# Patient Record
Sex: Female | Born: 1945
Health system: Southern US, Community
[De-identification: ages and names within clinical notes are randomized; demographics above are authoritative.]

## PROBLEM LIST (undated history)

## (undated) DIAGNOSIS — I10 Essential (primary) hypertension: Secondary | ICD-10-CM

## (undated) DIAGNOSIS — K529 Noninfective gastroenteritis and colitis, unspecified: Secondary | ICD-10-CM

## (undated) DIAGNOSIS — B029 Zoster without complications: Secondary | ICD-10-CM

## (undated) DIAGNOSIS — K5790 Diverticulosis of intestine, part unspecified, without perforation or abscess without bleeding: Secondary | ICD-10-CM

## (undated) DIAGNOSIS — I319 Disease of pericardium, unspecified: Secondary | ICD-10-CM

## (undated) DIAGNOSIS — R1013 Epigastric pain: Secondary | ICD-10-CM

## (undated) DIAGNOSIS — E559 Vitamin D deficiency, unspecified: Secondary | ICD-10-CM

## (undated) DIAGNOSIS — J189 Pneumonia, unspecified organism: Secondary | ICD-10-CM

## (undated) DIAGNOSIS — M1990 Unspecified osteoarthritis, unspecified site: Secondary | ICD-10-CM

## (undated) DIAGNOSIS — R251 Tremor, unspecified: Secondary | ICD-10-CM

## (undated) DIAGNOSIS — E063 Autoimmune thyroiditis: Secondary | ICD-10-CM

## (undated) DIAGNOSIS — J309 Allergic rhinitis, unspecified: Secondary | ICD-10-CM

## (undated) DIAGNOSIS — N281 Cyst of kidney, acquired: Secondary | ICD-10-CM

## (undated) DIAGNOSIS — Z8719 Personal history of other diseases of the digestive system: Secondary | ICD-10-CM

## (undated) DIAGNOSIS — L309 Dermatitis, unspecified: Secondary | ICD-10-CM

## (undated) DIAGNOSIS — Z9889 Other specified postprocedural states: Secondary | ICD-10-CM

## (undated) DIAGNOSIS — N200 Calculus of kidney: Secondary | ICD-10-CM

## (undated) DIAGNOSIS — E785 Hyperlipidemia, unspecified: Secondary | ICD-10-CM

## (undated) DIAGNOSIS — K219 Gastro-esophageal reflux disease without esophagitis: Secondary | ICD-10-CM

## (undated) DIAGNOSIS — I471 Supraventricular tachycardia, unspecified: Secondary | ICD-10-CM

## (undated) DIAGNOSIS — E039 Hypothyroidism, unspecified: Secondary | ICD-10-CM

## (undated) DIAGNOSIS — H209 Unspecified iridocyclitis: Secondary | ICD-10-CM

## (undated) DIAGNOSIS — E119 Type 2 diabetes mellitus without complications: Secondary | ICD-10-CM

## (undated) DIAGNOSIS — M3500 Sicca syndrome, unspecified: Secondary | ICD-10-CM

## (undated) DIAGNOSIS — R519 Headache, unspecified: Secondary | ICD-10-CM

## (undated) DIAGNOSIS — M8580 Other specified disorders of bone density and structure, unspecified site: Secondary | ICD-10-CM

## (undated) DIAGNOSIS — R2689 Other abnormalities of gait and mobility: Secondary | ICD-10-CM

## (undated) HISTORY — DX: Disease of pericardium, unspecified: I31.9

## (undated) HISTORY — PX: TUBAL LIGATION: SHX77

## (undated) HISTORY — DX: Hyperlipidemia, unspecified: E78.5

## (undated) HISTORY — DX: Other specified disorders of bone density and structure, unspecified site: M85.80

## (undated) HISTORY — DX: Pneumonia, unspecified organism: J18.9

## (undated) HISTORY — DX: Essential (primary) hypertension: I10

## (undated) HISTORY — DX: Sjogren syndrome, unspecified: M35.00

## (undated) HISTORY — PX: BREAST REDUCTION SURGERY: SHX8

## (undated) HISTORY — DX: Unspecified osteoarthritis, unspecified site: M19.90

## (undated) HISTORY — DX: Vitamin D deficiency, unspecified: E55.9

## (undated) HISTORY — DX: Autoimmune thyroiditis: E06.3

## (undated) HISTORY — DX: Zoster without complications: B02.9

## (undated) HISTORY — DX: Supraventricular tachycardia: I47.1

## (undated) HISTORY — DX: Diverticulosis of intestine, part unspecified, without perforation or abscess without bleeding: K57.90

## (undated) HISTORY — PX: DILATION AND CURETTAGE OF UTERUS: SHX78

## (undated) HISTORY — DX: Supraventricular tachycardia, unspecified: I47.10

## (undated) HISTORY — DX: Allergic rhinitis, unspecified: J30.9

## (undated) HISTORY — DX: Unspecified iridocyclitis: H20.9

## (undated) HISTORY — PX: LAPAROSCOPIC ENDOMETRIOSIS FULGURATION: SUR769

## (undated) HISTORY — DX: Epigastric pain: R10.13

## (undated) HISTORY — DX: Dermatitis, unspecified: L30.9

## (undated) HISTORY — DX: Hypothyroidism, unspecified: E03.9

## (undated) HISTORY — DX: Noninfective gastroenteritis and colitis, unspecified: K52.9

## (undated) HISTORY — DX: Calculus of kidney: N20.0

## (undated) HISTORY — DX: Headache, unspecified: R51.9

## (undated) HISTORY — DX: Other abnormalities of gait and mobility: R26.89

## (undated) HISTORY — PX: BLADDER REPAIR: SHX76

## (undated) HISTORY — DX: Tremor, unspecified: R25.1

---

## 1977-01-15 HISTORY — PX: OTHER SURGICAL HISTORY: SHX169

## 1997-01-15 DIAGNOSIS — I319 Disease of pericardium, unspecified: Secondary | ICD-10-CM

## 1997-01-15 HISTORY — DX: Disease of pericardium, unspecified: I31.9

## 1997-08-16 ENCOUNTER — Other Ambulatory Visit: Admission: RE | Admit: 1997-08-16 | Discharge: 1997-08-16 | Payer: Self-pay | Admitting: *Deleted

## 1998-06-30 ENCOUNTER — Emergency Department (HOSPITAL_COMMUNITY): Admission: EM | Admit: 1998-06-30 | Discharge: 1998-06-30 | Payer: Self-pay

## 1998-08-18 ENCOUNTER — Other Ambulatory Visit: Admission: RE | Admit: 1998-08-18 | Discharge: 1998-08-18 | Payer: Self-pay | Admitting: *Deleted

## 1999-03-16 ENCOUNTER — Encounter: Payer: Self-pay | Admitting: *Deleted

## 1999-03-16 ENCOUNTER — Encounter: Admission: RE | Admit: 1999-03-16 | Discharge: 1999-03-16 | Payer: Self-pay | Admitting: *Deleted

## 1999-08-23 ENCOUNTER — Other Ambulatory Visit: Admission: RE | Admit: 1999-08-23 | Discharge: 1999-08-23 | Payer: Self-pay | Admitting: *Deleted

## 2000-07-23 ENCOUNTER — Encounter: Payer: Self-pay | Admitting: *Deleted

## 2000-07-23 ENCOUNTER — Encounter: Admission: RE | Admit: 2000-07-23 | Discharge: 2000-07-23 | Payer: Self-pay | Admitting: *Deleted

## 2000-09-12 ENCOUNTER — Other Ambulatory Visit: Admission: RE | Admit: 2000-09-12 | Discharge: 2000-09-12 | Payer: Self-pay | Admitting: *Deleted

## 2000-11-13 ENCOUNTER — Emergency Department (HOSPITAL_COMMUNITY): Admission: EM | Admit: 2000-11-13 | Discharge: 2000-11-13 | Payer: Self-pay | Admitting: Emergency Medicine

## 2000-11-13 ENCOUNTER — Encounter: Payer: Self-pay | Admitting: Emergency Medicine

## 2001-07-29 ENCOUNTER — Encounter: Admission: RE | Admit: 2001-07-29 | Discharge: 2001-07-29 | Payer: Self-pay | Admitting: *Deleted

## 2001-07-29 ENCOUNTER — Encounter: Payer: Self-pay | Admitting: *Deleted

## 2002-01-05 ENCOUNTER — Other Ambulatory Visit: Admission: RE | Admit: 2002-01-05 | Discharge: 2002-01-05 | Payer: Self-pay | Admitting: *Deleted

## 2002-07-18 ENCOUNTER — Emergency Department (HOSPITAL_COMMUNITY): Admission: EM | Admit: 2002-07-18 | Discharge: 2002-07-18 | Payer: Self-pay | Admitting: *Deleted

## 2002-07-18 ENCOUNTER — Encounter: Payer: Self-pay | Admitting: *Deleted

## 2002-10-12 ENCOUNTER — Encounter: Payer: Self-pay | Admitting: *Deleted

## 2002-10-12 ENCOUNTER — Encounter: Admission: RE | Admit: 2002-10-12 | Discharge: 2002-10-12 | Payer: Self-pay | Admitting: *Deleted

## 2003-07-20 ENCOUNTER — Other Ambulatory Visit: Admission: RE | Admit: 2003-07-20 | Discharge: 2003-07-20 | Payer: Self-pay | Admitting: *Deleted

## 2004-04-23 ENCOUNTER — Emergency Department (HOSPITAL_COMMUNITY): Admission: EM | Admit: 2004-04-23 | Discharge: 2004-04-24 | Payer: Self-pay | Admitting: Emergency Medicine

## 2004-11-01 ENCOUNTER — Other Ambulatory Visit: Admission: RE | Admit: 2004-11-01 | Discharge: 2004-11-01 | Payer: Self-pay | Admitting: *Deleted

## 2005-07-09 ENCOUNTER — Encounter: Admission: RE | Admit: 2005-07-09 | Discharge: 2005-07-09 | Payer: Self-pay | Admitting: *Deleted

## 2005-12-05 ENCOUNTER — Other Ambulatory Visit: Admission: RE | Admit: 2005-12-05 | Discharge: 2005-12-05 | Payer: Self-pay | Admitting: *Deleted

## 2006-05-18 ENCOUNTER — Ambulatory Visit: Payer: Self-pay | Admitting: Vascular Surgery

## 2006-05-18 ENCOUNTER — Encounter: Payer: Self-pay | Admitting: Vascular Surgery

## 2006-05-18 ENCOUNTER — Emergency Department (HOSPITAL_COMMUNITY): Admission: EM | Admit: 2006-05-18 | Discharge: 2006-05-18 | Payer: Self-pay | Admitting: Emergency Medicine

## 2006-07-30 ENCOUNTER — Encounter: Admission: RE | Admit: 2006-07-30 | Discharge: 2006-07-30 | Payer: Self-pay | Admitting: *Deleted

## 2006-12-13 ENCOUNTER — Other Ambulatory Visit: Admission: RE | Admit: 2006-12-13 | Discharge: 2006-12-13 | Payer: Self-pay | Admitting: *Deleted

## 2007-07-31 ENCOUNTER — Encounter: Admission: RE | Admit: 2007-07-31 | Discharge: 2007-07-31 | Payer: Self-pay | Admitting: Gynecology

## 2008-01-13 ENCOUNTER — Other Ambulatory Visit: Admission: RE | Admit: 2008-01-13 | Discharge: 2008-01-13 | Payer: Self-pay | Admitting: Gynecology

## 2008-03-18 ENCOUNTER — Ambulatory Visit (HOSPITAL_COMMUNITY): Admission: RE | Admit: 2008-03-18 | Discharge: 2008-03-18 | Payer: Self-pay | Admitting: Urology

## 2008-03-18 ENCOUNTER — Emergency Department (HOSPITAL_COMMUNITY): Admission: EM | Admit: 2008-03-18 | Discharge: 2008-03-19 | Payer: Self-pay | Admitting: Emergency Medicine

## 2008-04-24 IMAGING — CR DG CHEST 2V
2 series · 2 of 2 positions shown · non-contrast
Comparison: Two view chest x-ray 04/23/2004.

CLINICAL DATA: Cough, shortness of breath. History of asthma. Bilateral lower
extremity edema.

CHEST - 2 VIEW  05/18/2006:

[w chest pa *]
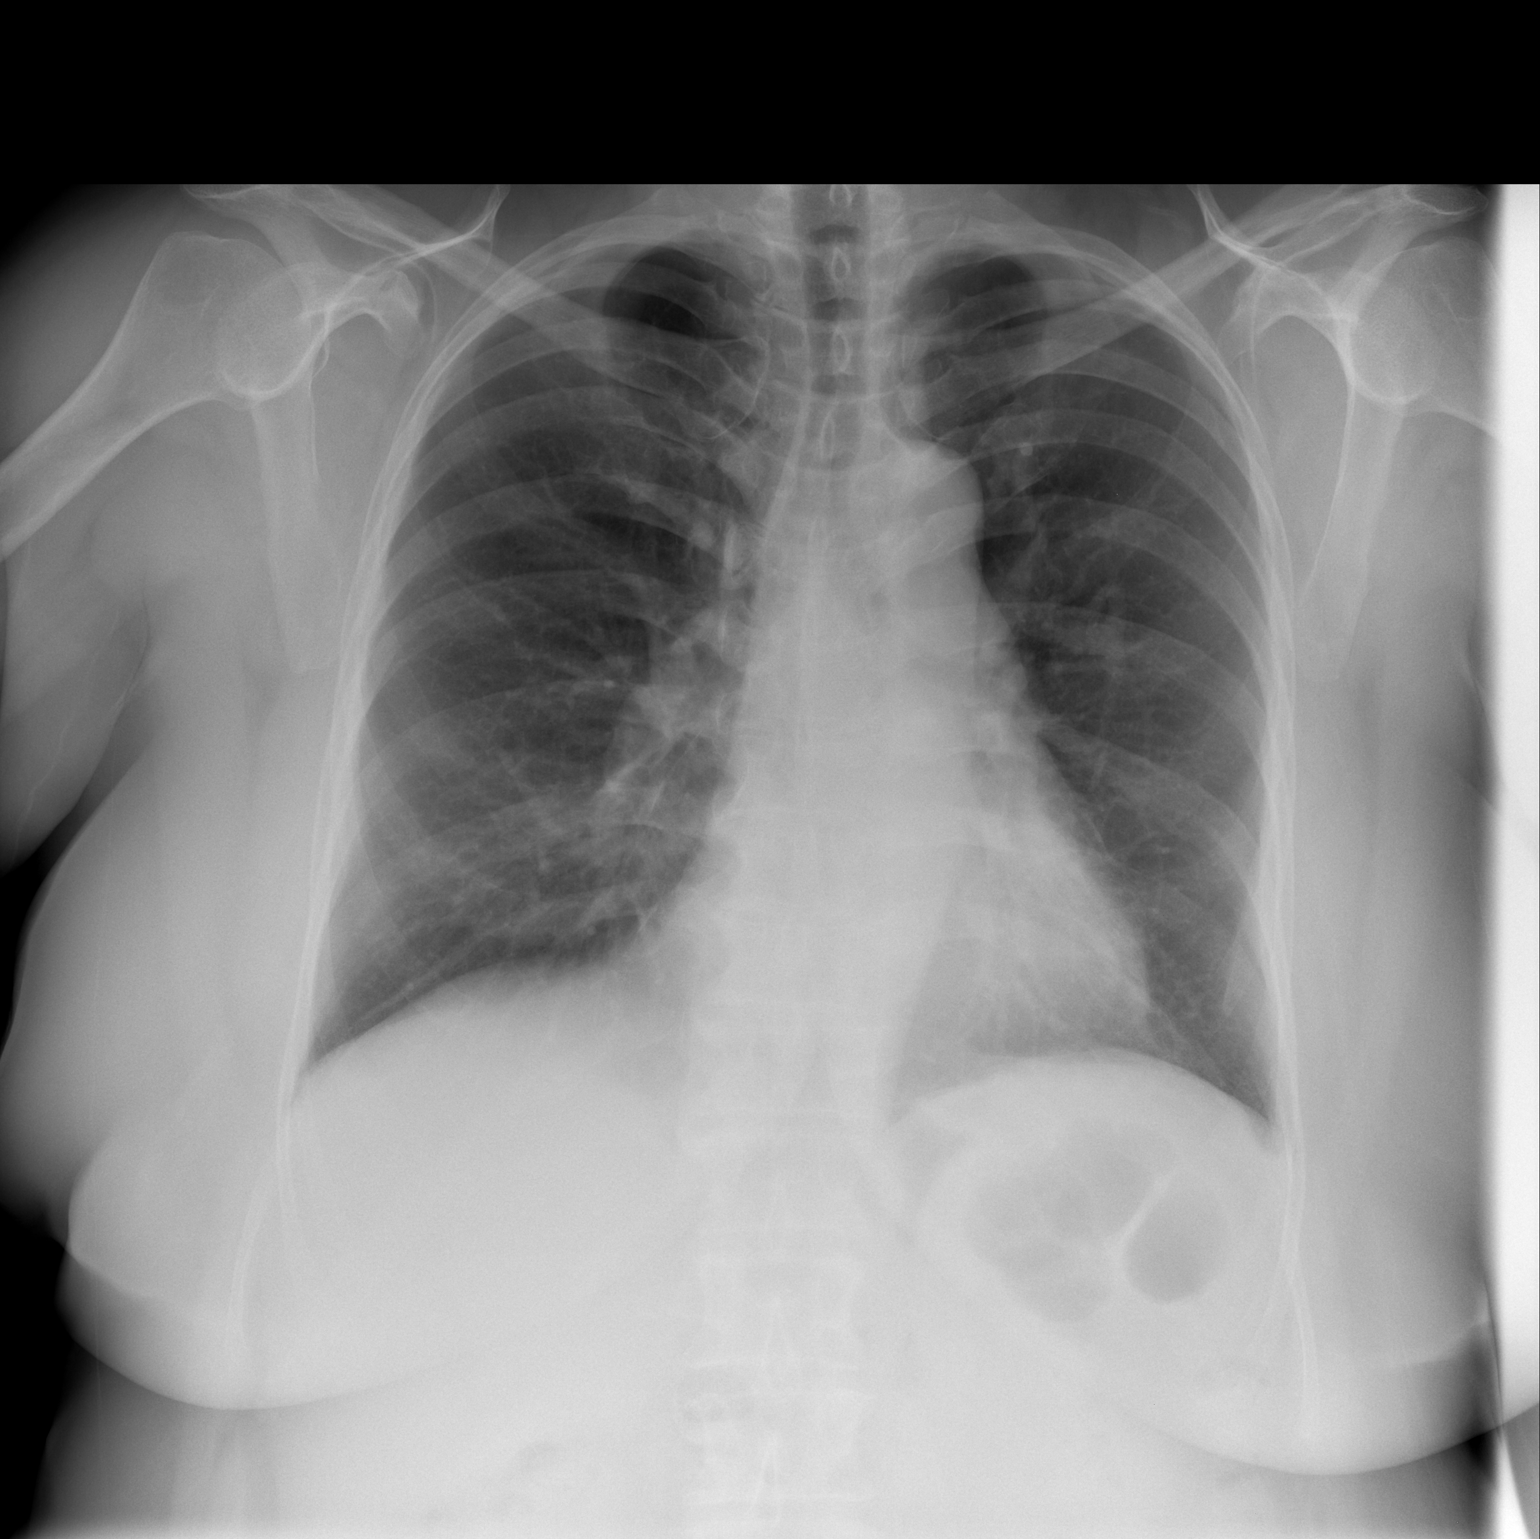

[w chest lat *]
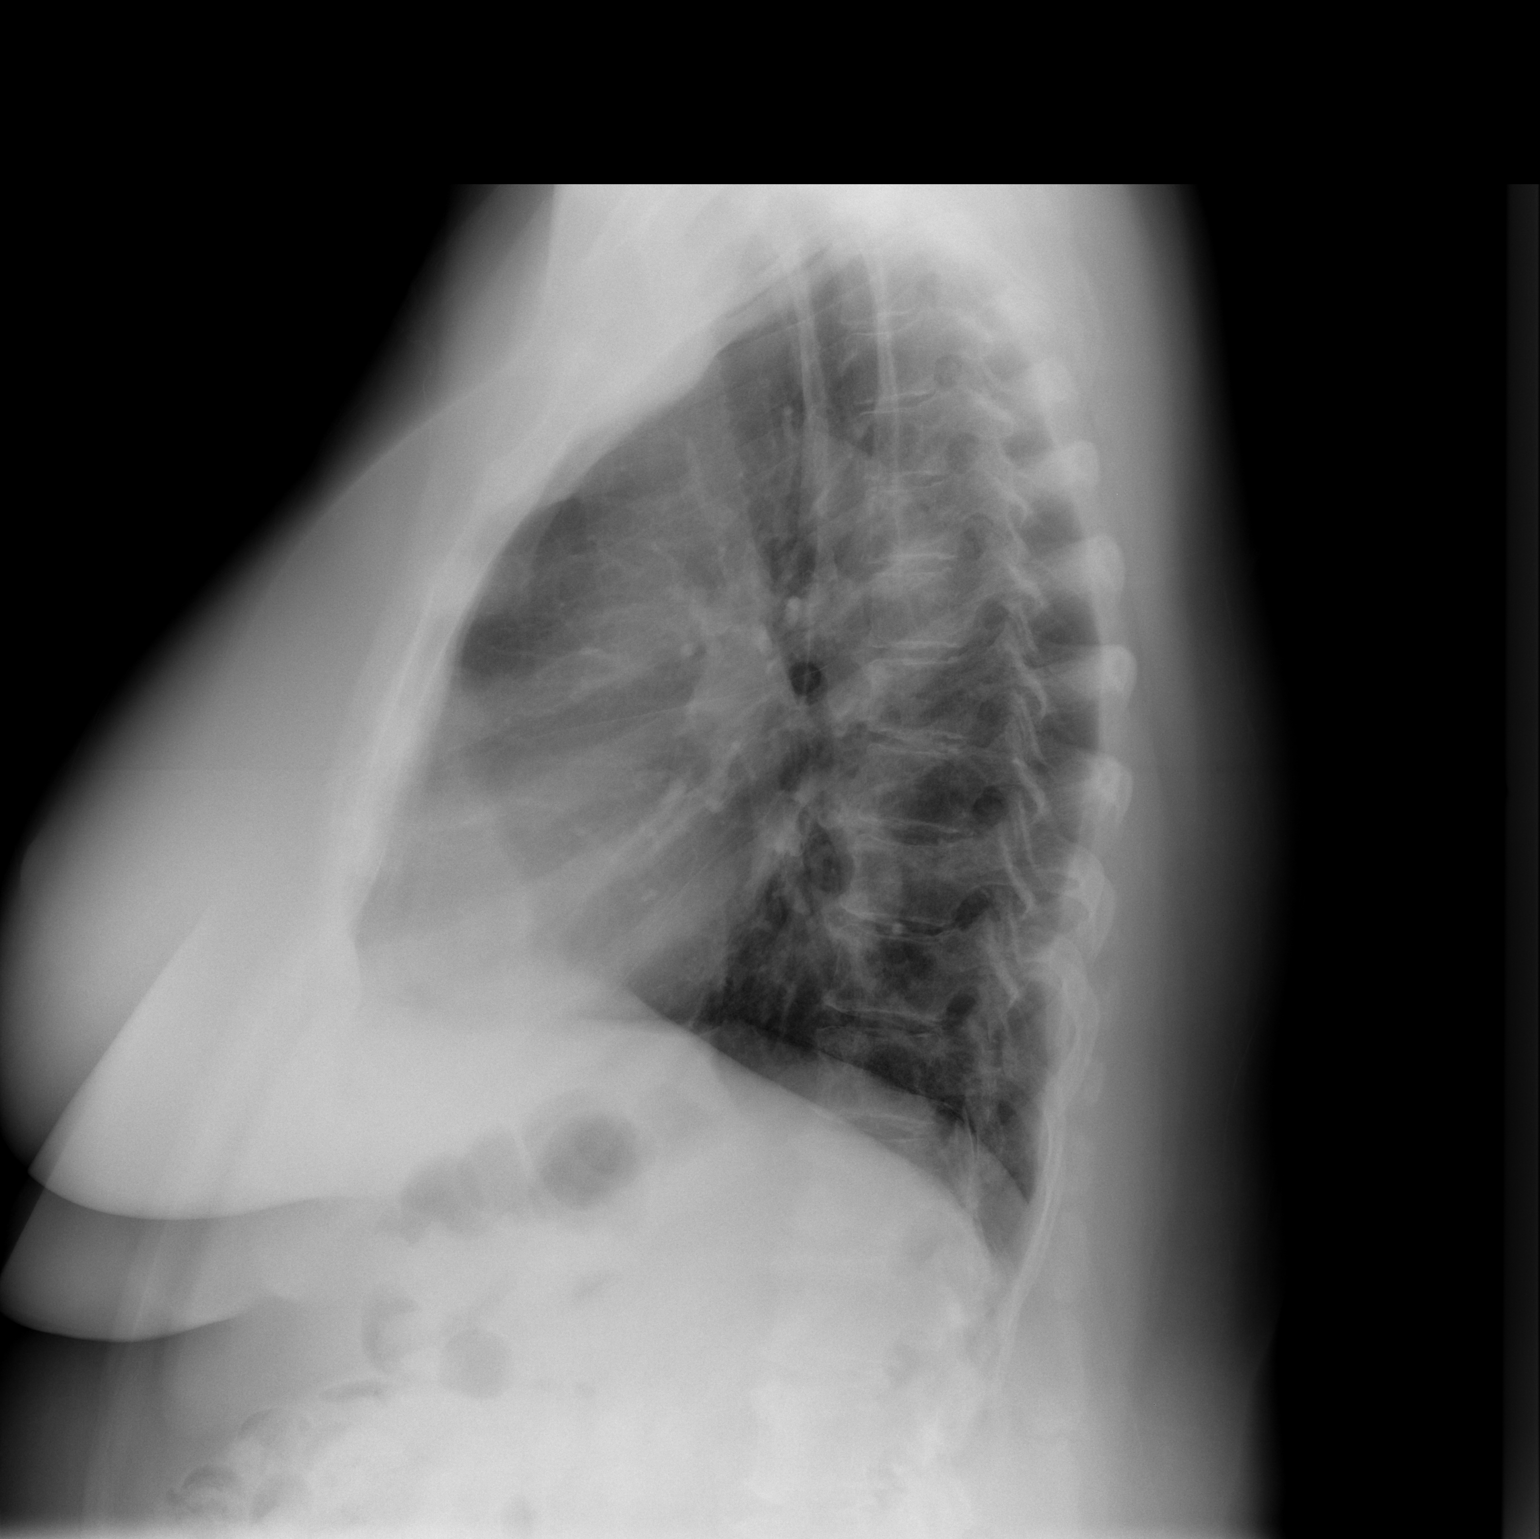

[2 of 2 positions shown; findings below may reference images not displayed]

FINDINGS: Heart size normal and stable. Thoracic aorta mildly tortuous but
unchanged. Hilar and mediastinal contours otherwise unremarkable. Pulmonary
parenchyma clear. No pleural effusions. Degenerative changes throughout the
thoracic spine. No significant interval change.
IMPRESSION: No acute cardiopulmonary disease. Stable since April 2004.

## 2008-08-02 ENCOUNTER — Encounter: Admission: RE | Admit: 2008-08-02 | Discharge: 2008-08-02 | Payer: Self-pay | Admitting: Gynecology

## 2009-07-27 HISTORY — PX: US ECHOCARDIOGRAPHY: HXRAD669

## 2009-08-04 ENCOUNTER — Encounter: Admission: RE | Admit: 2009-08-04 | Discharge: 2009-08-04 | Payer: Self-pay | Admitting: Gynecology

## 2009-11-17 ENCOUNTER — Ambulatory Visit (HOSPITAL_COMMUNITY): Admission: RE | Admit: 2009-11-17 | Discharge: 2009-11-17 | Payer: Self-pay | Admitting: Gynecology

## 2010-02-03 ENCOUNTER — Ambulatory Visit: Payer: Self-pay | Admitting: Cardiology

## 2010-04-27 LAB — URINE MICROSCOPIC-ADD ON

## 2010-04-27 LAB — URINALYSIS, ROUTINE W REFLEX MICROSCOPIC
Protein, ur: 100 mg/dL — AB
Specific Gravity, Urine: 1.011 (ref 1.005–1.030)
Urobilinogen, UA: 0.2 mg/dL (ref 0.0–1.0)
pH: 6 (ref 5.0–8.0)

## 2010-04-27 LAB — BASIC METABOLIC PANEL
Chloride: 103 mEq/L (ref 96–112)
Creatinine, Ser: 0.75 mg/dL (ref 0.4–1.2)
GFR calc Af Amer: >60 mL/min (ref >60)
Glucose, Bld: 123 mg/dL — ABNORMAL HIGH (ref 70–99)
Potassium: 3.5 mEq/L (ref 3.5–5.1)
Sodium: 135 mEq/L (ref 135–145)

## 2010-04-27 LAB — HEMOGLOBIN AND HEMATOCRIT, BLOOD: HCT: 38.7 % (ref 36.0–46.0)

## 2010-05-30 NOTE — Op Note (Signed)
Betty Fox, Betty Fox                 ACCOUNT NO.:  [id number]   MEDICAL RECORD NO.:  [id number]          PATIENT TYPE:  AMB   LOCATION:  DAY                          FACILITY:  [facility]   PHYSICIAN:  Sigmund I. Patsi Sears, M.D.DATE OF BIRTH:  09-27-1945   DATE OF PROCEDURE:  DATE OF DISCHARGE:                               OPERATIVE REPORT   PREOPERATIVE DIAGNOSES:  1. Left lower ureteral calculus.  2. Intrinsic sphincter deficiency.  3. Urinary continence.   POSTOPERATIVE DIAGNOSES:  1. Left lower ureteral calculus.  2. Intrinsic sphincter deficiency.  3. Urinary continence.  4. Left lower ureteral stricture.   OPERATIONS:  1. Cystourethroscopy, left retrograde pyelogram with interpretation of      left ureteroscopy.  2. Dilation of left ureteral stricture, left double-J stent measuring      6-French x 24 cm.  3. Injection of Macroplastique in the urethra for intrinsic sphincter      deficiency.   PREPARATION:  After appropriate preanesthesia, the patient is brought to  the operating room, placed on the operating room in dorsal supine  position where general LMA anesthesia was introduced.  She was then  replaced in dorsal lithotomy position where the pubis was prepped with  Betadine solution and draped in usual fashion.   REVIEW OF HISTORY:  This 65 year old female has a history of total  urinary incontinence, with urodynamic measured Valsalva leak point  pressure of 41 cm of water.  The evaluation by CT scan has shown to two  separate 2-mm left ureteral calculi.  The patient did pass once stone,  but has not seen the  other stone passed.  She is for retrograde  pyelography today for in addition to [facility] plastic injection.   PROCEDURE:  Cystourethroscopy was accomplished, and left retrograde  pyelogram shows a narrowing of the left lower ureter in the intramural  portion.  I do not see any intra-ureteral stone.  There is a small  calcification lateral to the left ureter  noted.  Ureteroscopy  accomplished, and left ureteral stricture is identified from previous  stone imbedded in her previous stone placement.  The ureter was scoped  to the mid ureter, and no stones identified.  Retrograde pyelogram  appears to be normal and there is no more hydronephrosis.  Because of  the ureteral stricture and dilation, I elected to pass a double-J stent,  and placed a 6 x 24 cm double-J stent in the left renal pelvis, and then  coiled in the bladder.  The bladder was drained of fluid, attention was  directed to the urethra.  Two vials of  Macroplastique were then injected per protocol in the submucosal  urethra, and the patient tolerated well.  Excellent ballooning of the  urethral mucosa was noted.  The scope was withdrawn, the patient was  given IV Toradol, awakened, taken recovery room in good condition.      Sigmund I. Patsi Sears, M.D.  Electronically Signed     SIT/MEDQ  D:  03/18/2008  T:  03/18/2008  Job:  [id number]

## 2010-09-19 ENCOUNTER — Other Ambulatory Visit: Payer: Self-pay | Admitting: Cardiology

## 2010-09-19 DIAGNOSIS — I119 Hypertensive heart disease without heart failure: Secondary | ICD-10-CM

## 2010-09-19 DIAGNOSIS — E876 Hypokalemia: Secondary | ICD-10-CM

## 2010-09-19 MED ORDER — ATENOLOL 25 MG PO TABS: 25.0000 mg | ORAL_TABLET | Freq: Every day | ORAL | Status: DC

## 2010-09-19 MED ORDER — POTASSIUM CHLORIDE 10 MEQ PO TBCR: 10.0000 meq | EXTENDED_RELEASE_TABLET | Freq: Two times a day (BID) | ORAL | Status: DC

## 2010-09-19 MED ORDER — AMLODIPINE BESYLATE 10 MG PO TABS: 10.0000 mg | ORAL_TABLET | Freq: Every day | ORAL | Status: DC

## 2010-09-19 NOTE — Telephone Encounter (Signed)
done

## 2010-09-19 NOTE — Telephone Encounter (Signed)
Called needing a 90 day refill of Amlodipine (Besylate) 10mg  with [facility] [phone] and she needs a short term supply (about 8 days) from [facility] [phone]. Please call back. I have pulled her chart.

## 2010-09-20 ENCOUNTER — Ambulatory Visit: Payer: Medicare Other | Attending: Internal Medicine | Admitting: Physical Therapy

## 2010-09-20 DIAGNOSIS — M6281 Muscle weakness (generalized): Secondary | ICD-10-CM | POA: Insufficient documentation

## 2010-09-20 DIAGNOSIS — M25569 Pain in unspecified knee: Secondary | ICD-10-CM | POA: Insufficient documentation

## 2010-09-20 DIAGNOSIS — IMO0001 Reserved for inherently not codable concepts without codable children: Secondary | ICD-10-CM | POA: Insufficient documentation

## 2010-09-27 ENCOUNTER — Ambulatory Visit: Payer: Medicare Other | Admitting: Physical Therapy

## 2010-09-28 ENCOUNTER — Ambulatory Visit: Payer: Medicare Other | Admitting: Physical Therapy

## 2010-10-06 ENCOUNTER — Encounter: Payer: Self-pay | Admitting: Cardiology

## 2010-10-10 ENCOUNTER — Ambulatory Visit: Payer: Medicare Other | Admitting: Physical Therapy

## 2010-10-12 ENCOUNTER — Ambulatory Visit: Payer: Medicare Other | Admitting: Physical Therapy

## 2010-10-17 ENCOUNTER — Ambulatory Visit: Payer: Medicare Other | Attending: Internal Medicine | Admitting: Physical Therapy

## 2010-10-17 DIAGNOSIS — IMO0001 Reserved for inherently not codable concepts without codable children: Secondary | ICD-10-CM | POA: Insufficient documentation

## 2010-10-17 DIAGNOSIS — M6281 Muscle weakness (generalized): Secondary | ICD-10-CM | POA: Insufficient documentation

## 2010-10-17 DIAGNOSIS — M25569 Pain in unspecified knee: Secondary | ICD-10-CM | POA: Insufficient documentation

## 2010-10-19 ENCOUNTER — Ambulatory Visit: Payer: Medicare Other | Admitting: Physical Therapy

## 2010-10-19 ENCOUNTER — Encounter: Payer: Self-pay | Admitting: Cardiology

## 2010-10-19 ENCOUNTER — Ambulatory Visit (INDEPENDENT_AMBULATORY_CARE_PROVIDER_SITE_OTHER): Payer: Medicare Other | Admitting: Cardiology

## 2010-10-19 VITALS — BP 110/73 | HR 55 | Ht 63.0 in | Wt 214.0 lb

## 2010-10-19 DIAGNOSIS — J45909 Unspecified asthma, uncomplicated: Secondary | ICD-10-CM | POA: Insufficient documentation

## 2010-10-19 DIAGNOSIS — E7800 Pure hypercholesterolemia, unspecified: Secondary | ICD-10-CM | POA: Insufficient documentation

## 2010-10-19 DIAGNOSIS — I119 Hypertensive heart disease without heart failure: Secondary | ICD-10-CM | POA: Insufficient documentation

## 2010-10-19 DIAGNOSIS — E039 Hypothyroidism, unspecified: Secondary | ICD-10-CM

## 2010-10-19 MED ORDER — ATORVASTATIN CALCIUM 10 MG PO TABS: 10.0000 mg | ORAL_TABLET | ORAL | Status: DC

## 2010-10-19 NOTE — Patient Instructions (Signed)
continue same dose of medications  Your physician wants you to follow-up in: 6 months You will receive a reminder letter in the mail two months in advance. If you don't receive a letter, please call our office to schedule the follow-up appointment.  

## 2010-10-19 NOTE — Assessment & Plan Note (Signed)
Patient has a past history of asthma.  She did use her inhaler yesterday.  She uses it just on when necessary basis or

## 2010-10-19 NOTE — Assessment & Plan Note (Signed)
The patient has a history of hypercholesterolemia.  She is on low dose Lipitor.  She is not having side effects from Lipitor to

## 2010-10-19 NOTE — Assessment & Plan Note (Signed)
The patient has not had any aches or dizziness.  She denies any increased exertional dyspnea.  No syncope

## 2010-10-19 NOTE — Progress Notes (Signed)
Betty Fox Date of Birth:  04/18/1945 [facility] [address]   [address] [place], [place]  [address] [phone]           Fax   [phone]  History of Present Illness: This pleasant 65 year old woman is seen for a scheduled followup six-month visit.  She has a history of essential hypertension, hypothyroidism, dyslipidemia, and asthma.  She has a history of remote pericarditis about 10 years ago.  She has a history of allergy to ACE inhibitors, which caused cough.  She has a history of supraventricular tachycardia.  These episodes can usually be aborted by doing a Valsalva.  Has a history of hypothyroidism, followed by Dr. Sharl Ma.  She is not diabetic  Current Outpatient Prescriptions  Medication Sig Dispense Refill  . amLODipine (NORVASC) 10 MG tablet Take 1 tablet (10 mg total) by mouth daily.  90 tablet  3  . atenolol (TENORMIN) 25 MG tablet Take 1 tablet (25 mg total) by mouth daily.  90 tablet  3  . atorvastatin (LIPITOR) 10 MG tablet Take 10 mg by mouth every other day.        . Beclomethasone Dipropionate (QVAR IN) Inhale into the lungs as needed.        . Cetirizine HCl (ZYRTEC PO) Take by mouth as needed.        . desoximetasone (TOPICORT) 0.25 % cream Apply topically as needed.        . hydrochlorothiazide (HYDRODIURIL) 25 MG tablet Take 25 mg by mouth every other day.        . levothyroxine (SYNTHROID, LEVOTHROID) 125 MCG tablet Take 125 mcg by mouth daily.        . potassium chloride (KLOR-CON 10) 10 MEQ CR tablet Take 1 tablet (10 mEq total) by mouth 2 (two) times daily.  180 tablet  3    Allergies  Allergen Reactions  . Ace Inhibitors   . Ampicillin   . Prinivil (Lisinopril) Cough  . Tums     Patient Active Problem List  Diagnoses  . Benign hypertensive heart disease without heart failure  . Pure hypercholesterolemia  . Hypothyroid  . Asthma    History  Smoking status  . Never Smoker   Smokeless tobacco  . Not on file     History  Alcohol Use No    No family history on file.  Review of Systems: Constitutional: no fever chills diaphoresis or fatigue or change in weight.  Head and neck: no hearing loss, no epistaxis, no photophobia or visual disturbance. Respiratory: No cough, shortness of breath or wheezing. Cardiovascular: No chest pain peripheral edema, palpitations. Gastrointestinal: No abdominal distention, no abdominal pain, no change in bowel habits hematochezia or melena. Genitourinary: No dysuria, no frequency, no urgency, no nocturia. Musculoskeletal:No arthralgias, no back pain, no gait disturbance or myalgias. Neurological: No dizziness, no headaches, no numbness, no seizures, no syncope, no weakness, no tremors. Hematologic: No lymphadenopathy, no easy bruising. Psychiatric: No confusion, no hallucinations, no sleep disturbance.    Physical Exam: Filed Vitals:   10/19/10 1146  BP: 110/73  Pulse: 55   General appearance reveals a well-developed, well-nourished woman in no distress.Pupils equal and reactive.   Extraocular Movements are full.  There is no scleral icterus.  The mouth and pharynx are normal.  The neck is supple.  The carotids reveal no bruits.  The jugular venous pressure is normal.  The thyroid is not enlarged.  There is no lymphadenopathy.  The chest  is clear to percussion and auscultation. There are no rales or rhonchi. Expansion of the chest is symmetrical.  The precordium is quiet.  The first heart sound is normal.  The second heart sound is physiologically split.  There is no murmur gallop rub or click.  There is no abnormal lift or heave.  The abdomen is soft and nontender. Bowel sounds are normal. The liver and spleen are not enlarged. There Are no abdominal masses. There are no bruits.  The pedal pulses are good.  There is no phlebitis or edema.  There is no cyanosis or clubbing. Strength is normal and symmetrical in all extremities.  There is no lateralizing  weakness.  There are no sensory deficits.  The skin is warm and dry.  There is no rash.   Assessment / Plan:  continue same medication.  She is getting physical therapy to help her balance and her osteoporosis and bone density twice a week and she should continue this.  Recheck in 6 months

## 2010-10-23 ENCOUNTER — Ambulatory Visit: Payer: Medicare Other | Admitting: Physical Therapy

## 2010-10-23 ENCOUNTER — Other Ambulatory Visit: Payer: Self-pay | Admitting: Cardiology

## 2010-10-30 ENCOUNTER — Ambulatory Visit: Payer: Medicare Other | Admitting: Physical Therapy

## 2010-11-02 ENCOUNTER — Ambulatory Visit: Payer: Medicare Other | Admitting: Physical Therapy

## 2011-04-30 ENCOUNTER — Ambulatory Visit (INDEPENDENT_AMBULATORY_CARE_PROVIDER_SITE_OTHER): Payer: Medicare Other | Admitting: Cardiology

## 2011-04-30 ENCOUNTER — Encounter: Payer: Self-pay | Admitting: Cardiology

## 2011-04-30 VITALS — BP 138/84 | HR 56 | Ht 63.0 in | Wt 206.0 lb

## 2011-04-30 DIAGNOSIS — J45909 Unspecified asthma, uncomplicated: Secondary | ICD-10-CM

## 2011-04-30 DIAGNOSIS — E7800 Pure hypercholesterolemia, unspecified: Secondary | ICD-10-CM

## 2011-04-30 DIAGNOSIS — I119 Hypertensive heart disease without heart failure: Secondary | ICD-10-CM

## 2011-04-30 DIAGNOSIS — R002 Palpitations: Secondary | ICD-10-CM

## 2011-04-30 NOTE — Assessment & Plan Note (Signed)
The patient has been having no chest pain or shortness of breath.  Since last visit she has been on a more careful diet and has lost 8 pounds and her energy level has improved

## 2011-04-30 NOTE — Assessment & Plan Note (Signed)
The patient brought in a copy of her recent lab work which is excellent.  We did not have to draw any labs today.

## 2011-04-30 NOTE — Assessment & Plan Note (Signed)
Her breathing has been satisfactory and she has not had any recent flareup of her asthma.

## 2011-04-30 NOTE — Progress Notes (Signed)
Betty Fox Date of Birth:  May 13, 1945 [facility] [address] [place], [place]  [address] [phone]         Fax   [phone]  History of Present Illness: This pleasant 66 year old woman is seen for a six-month followup office visit.  She has a history of essential hypertension hypothyroidism dyslipidemia and asthma.  About 10 years ago she had pericarditis.  She has had no recurrent symptoms of pericarditis.  She has a history of allergy to ACE inhibitors which cause a cough.  He's had a past history of supraventricular tachycardia which she can abort by doing a Valsalva maneuver.  He has a history of hypothyroidism followed by endocrinology.  Current Outpatient Prescriptions  Medication Sig Dispense Refill  . ALBUTEROL IN Inhale into the lungs. As needed      . amLODipine (NORVASC) 10 MG tablet Take 1 tablet (10 mg total) by mouth daily.  90 tablet  3  . atenolol (TENORMIN) 25 MG tablet Take 1 tablet (25 mg total) by mouth daily.  90 tablet  3  . atorvastatin (LIPITOR) 10 MG tablet Take 1 tablet (10 mg total) by mouth every other day.  45 tablet  3  . Beclomethasone Dipropionate (QVAR IN) Inhale into the lungs as needed.        . Cetirizine HCl (ZYRTEC PO) Take by mouth as needed.        . desoximetasone (TOPICORT) 0.25 % cream Apply topically as needed.        . hydrochlorothiazide (HYDRODIURIL) 25 MG tablet TAKE 1 TABLET EVERY OTHER DAY OR AS DIRECTED  90 tablet  3  . levothyroxine (SYNTHROID, LEVOTHROID) 125 MCG tablet Take 125 mcg by mouth daily.        . potassium chloride (KLOR-CON 10) 10 MEQ CR tablet Take 1 tablet (10 mEq total) by mouth 2 (two) times daily.  180 tablet  3    Allergies  Allergen Reactions  . Ace Inhibitors   . Ampicillin   . Prinivil (Lisinopril) Cough  . Tums     Patient Active Problem List  Diagnoses  . Benign hypertensive heart disease without heart failure  . Pure hypercholesterolemia  . Hypothyroid  . Asthma     History  Smoking status  . Never Smoker   Smokeless tobacco  . Not on file    History  Alcohol Use No    No family history on file.  Review of Systems: Constitutional: no fever chills diaphoresis or fatigue or change in weight.  Head and neck: no hearing loss, no epistaxis, no photophobia or visual disturbance. Respiratory: No cough, shortness of breath or wheezing. Cardiovascular: No chest pain peripheral edema, palpitations. Gastrointestinal: No abdominal distention, no abdominal pain, no change in bowel habits hematochezia or melena. Genitourinary: No dysuria, no frequency, no urgency, no nocturia. Musculoskeletal:No arthralgias, no back pain, no gait disturbance or myalgias. Neurological: No dizziness, no headaches, no numbness, no seizures, no syncope, no weakness, no tremors. Hematologic: No lymphadenopathy, no easy bruising. Psychiatric: No confusion, no hallucinations, no sleep disturbance.    Physical Exam: Filed Vitals:   04/30/11 0950  BP: 138/84  Pulse: 56   the general appearance reveals a well-developed slightly obese woman in no distress.Pupils equal and reactive.   Extraocular Movements are full.  There is no scleral icterus.  The mouth and pharynx are normal.  The neck is supple.  The carotids reveal no bruits.  The jugular venous pressure is normal.  The thyroid  is not enlarged.  There is no lymphadenopathy.  The chest is clear to percussion and auscultation. There are no rales or rhonchi. Expansion of the chest is symmetrical.  The precordium is quiet.  The first heart sound is normal.  The second heart sound is physiologically split.  There is no murmur gallop rub or click.  There is no abnormal lift or heave.  The abdomen is soft and nontender. Bowel sounds are normal. The liver and spleen are not enlarged. There Are no abdominal masses. There are no bruits.  The pedal pulses are good.  There is no phlebitis or edema.  There is no cyanosis or  clubbing. Strength is normal and symmetrical in all extremities.  There is no lateralizing weakness.  There are no sensory deficits.  The skin is warm and dry.  There is no rash.   EKG today shows sinus bradycardia and is otherwise within normal limits.   Assessment / Plan: Continue same medication.  She will be having a wisdom tooth removed by Dr. Bradly Chris recently.  She may have to see a podiatrist about some problems with heel spurs.  She will continue careful low carbohydrate high fiber diet with weight loss.  Recheck in 6 months for office visit and basal metabolic panel.  She does have a past history of low potassium.

## 2011-04-30 NOTE — Patient Instructions (Signed)
Your physician wants you to follow-up in: 6 months with Dr. Patty Sermons.  You will receive a reminder letter in the mail two months in advance. If you don't receive a letter, please call our office to schedule the follow-up appointment.  Your physician recommends that you return for lab work in: 6 months  - BMET

## 2011-06-27 ENCOUNTER — Encounter: Payer: Self-pay | Admitting: Cardiology

## 2011-08-06 ENCOUNTER — Other Ambulatory Visit: Payer: Self-pay | Admitting: Dermatology

## 2011-09-12 ENCOUNTER — Other Ambulatory Visit: Payer: Self-pay | Admitting: Cardiology

## 2011-09-12 NOTE — Telephone Encounter (Signed)
Refilled atenolol

## 2011-09-26 ENCOUNTER — Other Ambulatory Visit: Payer: Self-pay | Admitting: Cardiology

## 2011-09-26 NOTE — Telephone Encounter (Signed)
Refilled hctz 

## 2011-10-18 ENCOUNTER — Ambulatory Visit: Payer: Medicare Other | Admitting: Cardiology

## 2011-11-19 ENCOUNTER — Ambulatory Visit (INDEPENDENT_AMBULATORY_CARE_PROVIDER_SITE_OTHER): Payer: Medicare Other | Admitting: Cardiology

## 2011-11-19 ENCOUNTER — Encounter: Payer: Self-pay | Admitting: Cardiology

## 2011-11-19 VITALS — BP 150/100 | HR 56 | Ht 63.0 in | Wt 213.0 lb

## 2011-11-19 DIAGNOSIS — R002 Palpitations: Secondary | ICD-10-CM

## 2011-11-19 DIAGNOSIS — I119 Hypertensive heart disease without heart failure: Secondary | ICD-10-CM

## 2011-11-19 DIAGNOSIS — E7800 Pure hypercholesterolemia, unspecified: Secondary | ICD-10-CM

## 2011-11-19 NOTE — Assessment & Plan Note (Signed)
Her blood pressure is high today.  She's had some mild headaches recently.  She has recently finished a course of prednisone.  She is already on beta blocker, hydrochlorothiazide, and amlodipine.  She cannot take ACE inhibitors.  We discussed possible addition of an ARB but decided to hold off to see if her pressure would normalize as she distances herself from the prednisone.  She has several other doctors visits scheduled in the next several weeks where her blood pressure will be checked.  If he remains elevated she will contact us and we can call in ARB.

## 2011-11-19 NOTE — Progress Notes (Signed)
Betty Fox Date of Birth:  05-03-1945 [facility] [address] [place], [place]  [address] [phone]         Fax   [phone]  History of Present Illness: This pleasant 66 year old woman is seen for a six-month followup office visit. She has a history of essential hypertension, hypothyroidism, dyslipidemia,and asthma. About 10 years ago she had pericarditis. She has had no recurrent symptoms of pericarditis. She has a history of allergy to ACE inhibitors which cause a cough. He's had a past history of supraventricular tachycardia which she can abort by doing a Valsalva maneuver. He has a history of hypothyroidism followed by endocrinology.  She is not known to be diabetic.  She does not have any history of ischemic heart disease.   Current Outpatient Prescriptions  Medication Sig Dispense Refill  . ALBUTEROL IN Inhale into the lungs. As needed      . amLODipine (NORVASC) 10 MG tablet Take 1 tablet (10 mg total) by mouth daily.  90 tablet  3  . atenolol (TENORMIN) 25 MG tablet TAKE 1 TABLET DAILY  90 tablet  3  . atorvastatin (LIPITOR) 10 MG tablet Take 1 tablet (10 mg total) by mouth every other day.  45 tablet  3  . Beclomethasone Dipropionate (QVAR IN) Inhale into the lungs as needed.        . Cetirizine HCl (ZYRTEC PO) Take by mouth as needed.        . desoximetasone (TOPICORT) 0.25 % cream Apply topically as needed.        Marland Kitchen EPIPEN 2-PAK 0.3 MG/0.3ML DEVI       . hydrochlorothiazide (HYDRODIURIL) 25 MG tablet TAKE 1 TABLET EVERY OTHER DAY OR AS DIRECTED  90 tablet  3  . levothyroxine (SYNTHROID, LEVOTHROID) 125 MCG tablet Take 125 mcg by mouth daily.        . potassium chloride (KLOR-CON 10) 10 MEQ CR tablet Take 1 tablet (10 mEq total) by mouth 2 (two) times daily.  180 tablet  3  . PREMARIN vaginal cream         Allergies  Allergen Reactions  . Ace Inhibitors   . Ampicillin   . Calcium Carbonate Antacid   . Prinivil (Lisinopril) Cough     Patient Active Problem List  Diagnosis  . Benign hypertensive heart disease without heart failure  . Pure hypercholesterolemia  . Hypothyroid  . Asthma  . Palpitations    History  Smoking status  . Never Smoker   Smokeless tobacco  . Not on file    History  Alcohol Use No    No family history on file.  Review of Systems: Constitutional: no fever chills diaphoresis or fatigue or change in weight.  Head and neck: no hearing loss, no epistaxis, no photophobia or visual disturbance. Respiratory: No cough, shortness of breath or wheezing. Cardiovascular: No chest pain peripheral edema, palpitations. Gastrointestinal: No abdominal distention, no abdominal pain, no change in bowel habits hematochezia or melena. Genitourinary: No dysuria, no frequency, no urgency, no nocturia. Musculoskeletal:No arthralgias, no back pain, no gait disturbance or myalgias. Neurological: No dizziness, no headaches, no numbness, no seizures, no syncope, no weakness, no tremors. Hematologic: No lymphadenopathy, no easy bruising. Psychiatric: No confusion, no hallucinations, no sleep disturbance.    Physical Exam: Filed Vitals:   11/19/11 1610  BP: 150/100  Pulse: 56   the general appearance reveals a well-developed well-nourished woman in no distress.The head and neck exam reveals pupils equal and reactive.  Extraocular movements are full.  There is no scleral icterus.  The mouth and pharynx are normal.  The neck is supple.  The carotids reveal no bruits.  The jugular venous pressure is normal.  The  thyroid is not enlarged.  There is no lymphadenopathy.  The chest is clear to percussion and auscultation.  There are no rales or rhonchi.  Expansion of the chest is symmetrical.  The precordium is quiet.  The first heart sound is normal.  The second heart sound is physiologically split.  There is no murmur gallop rub or click.  There is no abnormal lift or heave.  The abdomen is soft and nontender.   The bowel sounds are normal.  The liver and spleen are not enlarged.  There are no abdominal masses.  There are no abdominal bruits.  Extremities reveal good pedal pulses.  There is no phlebitis or edema.  There is no cyanosis or clubbing.  Strength is normal and symmetrical in all extremities.  There is no lateralizing weakness.  There are no sensory deficits.  The skin is warm and dry.  There is no rash.     Assessment / Plan: Continue same medication.  Consider adding ARB such as losartan if blood pressure remains elevated.  Recheck in 6 months for followup office visit and EKG.

## 2011-11-19 NOTE — Assessment & Plan Note (Signed)
She has a history of asthma and allergic sinusitis.  She recently saw her allergist who treated her with antibiotics and a course of prednisone.  She is just finishing the prednisone.  Her weight and her blood pressure are elevated today

## 2011-11-19 NOTE — Patient Instructions (Addendum)
Your physician recommends that you continue on your current medications as directed. Please refer to the Current Medication list given to you today.  Your physician wants you to follow-up in: 6 months. You will receive a reminder letter in the mail two months in advance. If you don't receive a letter, please call our office to schedule the follow-up appointment.  

## 2011-11-19 NOTE — Assessment & Plan Note (Signed)
Patient has not had any recent palpitations.

## 2011-11-19 NOTE — Assessment & Plan Note (Signed)
Her lipids are monitored by her primary care provider

## 2011-11-23 ENCOUNTER — Other Ambulatory Visit: Payer: Self-pay | Admitting: *Deleted

## 2011-11-23 MED ORDER — POTASSIUM CHLORIDE ER 10 MEQ PO TBCR: 10.0000 meq | EXTENDED_RELEASE_TABLET | Freq: Two times a day (BID) | ORAL | Status: DC

## 2011-12-04 ENCOUNTER — Other Ambulatory Visit: Payer: Self-pay | Admitting: *Deleted

## 2011-12-04 DIAGNOSIS — I119 Hypertensive heart disease without heart failure: Secondary | ICD-10-CM

## 2011-12-04 MED ORDER — AMLODIPINE BESYLATE 10 MG PO TABS: 10.0000 mg | ORAL_TABLET | Freq: Every day | ORAL | Status: DC

## 2011-12-31 ENCOUNTER — Other Ambulatory Visit: Payer: Self-pay | Admitting: Diagnostic Neuroimaging

## 2011-12-31 DIAGNOSIS — R251 Tremor, unspecified: Secondary | ICD-10-CM

## 2012-01-21 ENCOUNTER — Ambulatory Visit
Admission: RE | Admit: 2012-01-21 | Discharge: 2012-01-21 | Disposition: A | Discharge status: Home / Self Care | Payer: Medicare Other | Source: Ambulatory Visit | Attending: Diagnostic Neuroimaging | Admitting: Diagnostic Neuroimaging

## 2012-01-21 DIAGNOSIS — R251 Tremor, unspecified: Secondary | ICD-10-CM

## 2012-01-21 MED ORDER — GADOBENATE DIMEGLUMINE 529 MG/ML IV SOLN
9.0000 mL | Freq: Once | INTRAVENOUS | Status: AC | PRN
  Administered 2012-01-21: 9 mL via INTRAVENOUS

## 2012-04-01 ENCOUNTER — Telehealth: Payer: Self-pay | Admitting: Cardiology

## 2012-04-01 DIAGNOSIS — I119 Hypertensive heart disease without heart failure: Secondary | ICD-10-CM

## 2012-04-01 NOTE — Telephone Encounter (Signed)
Called to [facility] as requested

## 2012-04-01 NOTE — Telephone Encounter (Signed)
New problem   Pt need a new prescription for 10 day suppy of Amlodipine Besylate 10mg  call in to [facility]/ [phone].

## 2012-05-13 ENCOUNTER — Ambulatory Visit (INDEPENDENT_AMBULATORY_CARE_PROVIDER_SITE_OTHER): Payer: Medicare Other | Admitting: Diagnostic Neuroimaging

## 2012-05-13 ENCOUNTER — Encounter: Payer: Self-pay | Admitting: Diagnostic Neuroimaging

## 2012-05-13 VITALS — BP 139/74 | HR 54 | Temp 98.0°F | Ht 63.0 in | Wt 238.0 lb

## 2012-05-13 DIAGNOSIS — G252 Other specified forms of tremor: Secondary | ICD-10-CM

## 2012-05-13 DIAGNOSIS — G250 Essential tremor: Secondary | ICD-10-CM

## 2012-05-13 NOTE — Progress Notes (Signed)
[facility]  PATIENT: Betty Fox DOB: [birth year]  REFERRING CLINICIAN:  HISTORY FROM: patient REASON FOR VISIT: follow up   HISTORICAL  CHIEF COMPLAINT:  Chief Complaint  Patient presents with  . Follow-up    HISTORY OF PRESENT ILLNESS:   UPDATE 05/13/12: Since last visit patient had one episode of significant tremor while at church. Patient got up to go to the alter and began to have whole-body tremors. Patient did not lose consciousness. Symptoms last 45 minutes. She went downstairs had something to eat. Symptoms resolved on their own. Today patient feels well. No significant tremor. She has noted deterioration of her handwriting over time.  Regarding her MRI brain study, it shows a small possible pituitary macroadenoma. She's followed up with endocrinology for blood testing which apparently has been stable.  PRIOR HPI (12/28/11): 67 year old female with history of hypertension, Hashimoto's thyroiditis with hypothyroidism, asthma, idiopathic hyperprolactinemia, supraventricular tachycardia, here for evaluation of tremor.  Patient reports history of mild postural and action tremor of left upper extremity since 1999 when she was diagnosed with acute pericarditis. Tremor has gradually progressed over that time. She also has intermittent head tremor. She does not notice much resting tremor. No balance or gait difficulties subjectively.  REVIEW OF SYSTEMS: Full 14 system review of systems performed and notable only for weight gain fatigue blurred vision eye pain cough wheezing blood in urine allergies tremor headache.  ALLERGIES: Allergies  Allergen Reactions  . Ampicillin Hives and Swelling  . Ace Inhibitors Cough  . Calcium Carbonate Antacid Hives  . Prinivil (Lisinopril) Cough    HOME MEDICATIONS: Outpatient Prescriptions Prior to Visit  Medication Sig Dispense Refill  . ALBUTEROL IN Inhale into the lungs. As needed      . amLODipine (NORVASC) 10 MG  tablet Take 1 tablet (10 mg total) by mouth daily.  90 tablet  3  . atenolol (TENORMIN) 25 MG tablet TAKE 1 TABLET DAILY  90 tablet  3  . Beclomethasone Dipropionate (QVAR IN) Inhale into the lungs as needed.        . Cetirizine HCl (ZYRTEC PO) Take by mouth as needed.        . desoximetasone (TOPICORT) 0.25 % cream Apply topically as needed.        Marland Kitchen EPIPEN 2-PAK 0.3 MG/0.3ML DEVI       . hydrochlorothiazide (HYDRODIURIL) 25 MG tablet TAKE 1 TABLET EVERY OTHER DAY OR AS DIRECTED  90 tablet  3  . levothyroxine (SYNTHROID, LEVOTHROID) 125 MCG tablet Take 125 mcg by mouth daily.        . potassium chloride (K-DUR) 10 MEQ tablet Take 1 tablet (10 mEq total) by mouth 2 (two) times daily.  180 tablet  2  . PREMARIN vaginal cream       . atorvastatin (LIPITOR) 10 MG tablet Take 1 tablet (10 mg total) by mouth every other day.  45 tablet  3   No facility-administered medications prior to visit.    PAST MEDICAL HISTORY: Past Medical History  Diagnosis Date  . Hypertension   . Hyperlipidemia   . Asthma   . Hypothyroidism   . Pericarditis   . PSVT (paroxysmal supraventricular tachycardia)   . Uveitis   . Dyspepsia     PAST SURGICAL HISTORY: Past Surgical History  Procedure Laterality Date  . Bladder repair    . US echocardiography  07/27/2009    EF 55-60%    FAMILY HISTORY: No family history on file.  SOCIAL HISTORY:  History   Social History  . Marital Status: Married    Spouse Name: N/A    Number of Children: N/A  . Years of Education: N/A   Occupational History  . Not on file.   Social History Main Topics  . Smoking status: Never Smoker   . Smokeless tobacco: Not on file  . Alcohol Use: No  . Drug Use: No  . Sexually Active:    Other Topics Concern  . Not on file   Social History Narrative  . No narrative on file     PHYSICAL EXAM  Filed Vitals:   05/13/12 1406  BP: 139/74  Pulse: 54  Temp: 98 F (36.7 C)  TempSrc: Oral  Height: 5\' 3"  (1.6 m)    Weight: 238 lb (107.956 kg)   Body mass index is 42.17 kg/(m^2).  GENERAL EXAM: Patient is in no distress  CARDIOVASCULAR: Regular rate and rhythm, no murmurs, no carotid bruits  NEUROLOGIC: MENTAL STATUS: awake, alert, language fluent, comprehension intact, naming intact CRANIAL NERVE: no papilledema on fundoscopic exam, pupils equal and reactive to light, visual fields full to confrontation, extraocular muscles intact, no nystagmus, facial sensation and strength symmetric, uvula midline, shoulder shrug symmetric, tongue midline. MOTOR: MILD POSTURAL TREMOR. Normal bulk and tone, full strength in the BUE, BLE SENSORY: normal and symmetric to light touch COORDINATION: finger-nose-finger, fine finger movements normal REFLEXES: deep tendon reflexes present and symmetric GAIT/STATION: narrow based gait; able to walk on toes, heels and tandem; romberg is negative   DIAGNOSTIC DATA (LABS, IMAGING, TESTING) - I reviewed patient records, labs, notes, testing and imaging myself where available.  Lab Results  Component Value Date   HGB 12.9 03/18/2008   HCT 38.7 03/18/2008      Component Value Date/Time   NA 135 03/18/2008 0700   K 3.5 03/18/2008 0700   CL 103 03/18/2008 0700   CO2 27 03/18/2008 0700   GLUCOSE 123* 03/18/2008 0700   BUN 12 03/18/2008 0700   CREATININE 0.75 03/18/2008 0700   CALCIUM 8.7 03/18/2008 0700   GFRNONAA >60 03/18/2008 0700   GFRAA  Value: >60        The eGFR has been calculated using the MDRD equation. This calculation has not been validated in all clinical situations. eGFR's persistently <60 mL/min signify possible Chronic Kidney Disease. 03/18/2008 0700   No results found for this basename: CHOL, HDL, LDLCALC, LDLDIRECT, TRIG, CHOLHDL   No results found for this basename: HGBA1C   No results found for this basename: VITAMINB12   No results found for this basename: TSH    01/21/12 MRI brain and pituitary - dynamic pituitary imaging shows a 5mm hypo-enhancing lesion in the  left pituitary region, may represent a pituitary microadenoma; mild chronic small vessel ischemic disease   ASSESSMENT AND PLAN  67 y.o. year old female  has a past medical history of Hypertension; Hyperlipidemia; Asthma; Hypothyroidism; Pericarditis; PSVT (paroxysmal supraventricular tachycardia); Uveitis; and Dyspepsia. here with tremor.  Exam shows postural tremor. Likely essential tremor versus enhanced physiologic tremor.  PLAN: 1. Observation; will consider essential tremor meds if it progresses    Suanne Marker, MD 05/13/2012, 2:31 PM Certified in Neurology, Neurophysiology and Neuroimaging  [facility] [address], [address] [place], [place] [address] [phone]

## 2012-05-13 NOTE — Patient Instructions (Signed)
Observe tremor for now. If it worsens, we can consider medication for essential tremor treatment.

## 2012-05-19 ENCOUNTER — Encounter: Payer: Self-pay | Admitting: Cardiology

## 2012-05-19 ENCOUNTER — Ambulatory Visit (INDEPENDENT_AMBULATORY_CARE_PROVIDER_SITE_OTHER): Payer: Medicare Other | Admitting: Cardiology

## 2012-05-19 VITALS — BP 142/86 | HR 56 | Ht 63.5 in | Wt 214.4 lb

## 2012-05-19 DIAGNOSIS — E119 Type 2 diabetes mellitus without complications: Secondary | ICD-10-CM | POA: Insufficient documentation

## 2012-05-19 DIAGNOSIS — I119 Hypertensive heart disease without heart failure: Secondary | ICD-10-CM

## 2012-05-19 DIAGNOSIS — IMO0001 Reserved for inherently not codable concepts without codable children: Secondary | ICD-10-CM

## 2012-05-19 DIAGNOSIS — E039 Hypothyroidism, unspecified: Secondary | ICD-10-CM

## 2012-05-19 NOTE — Patient Instructions (Addendum)
Your physician recommends that you continue on your current medications as directed. Please refer to the Current Medication list given to you today.  Your physician wants you to follow-up in: 6 month ov You will receive a reminder letter in the mail two months in advance. If you don't receive a letter, please call our office to schedule the follow-up appointment.  

## 2012-05-19 NOTE — Progress Notes (Signed)
Betty Fox Date of Birth:  08-22-1945 [facility] [address] [place], [place]  [address] [phone]         Fax   [phone]  History of Present Illness: This pleasant 67 year old woman is seen for a six-month followup office visit. She has a history of essential hypertension, hypothyroidism, dyslipidemia,and asthma. About 10 years ago she had pericarditis. She has had no recurrent symptoms of pericarditis. She has a history of allergy to ACE inhibitors which cause a cough. He's had a past history of supraventricular tachycardia which she can abort by doing a Valsalva maneuver. He has a history of hypothyroidism followed by endocrinology. She has a history of mild diabetes. She does not have any history of ischemic heart disease.   Current Outpatient Prescriptions  Medication Sig Dispense Refill  . ALBUTEROL IN Inhale into the lungs. As needed      . amLODipine (NORVASC) 10 MG tablet Take 1 tablet (10 mg total) by mouth daily.  90 tablet  3  . atenolol (TENORMIN) 25 MG tablet TAKE 1 TABLET DAILY  90 tablet  3  . Beclomethasone Dipropionate (QVAR IN) Inhale into the lungs as needed.        . Cetirizine HCl (ZYRTEC PO) Take by mouth as needed.        . desoximetasone (TOPICORT) 0.25 % cream Apply topically as needed.        Marland Kitchen EPIPEN 2-PAK 0.3 MG/0.3ML DEVI       . hydrochlorothiazide (HYDRODIURIL) 25 MG tablet TAKE 1 TABLET EVERY OTHER DAY OR AS DIRECTED  90 tablet  3  . levothyroxine (SYNTHROID, LEVOTHROID) 125 MCG tablet Take 125 mcg by mouth daily.        . potassium chloride (K-DUR) 10 MEQ tablet Take 1 tablet (10 mEq total) by mouth 2 (two) times daily.  180 tablet  2  . valACYclovir (VALTREX) 1000 MG tablet Take by mouth as needed.        No current facility-administered medications for this visit.    Allergies  Allergen Reactions  . Ampicillin Hives and Swelling  . Ace Inhibitors Cough  . Calcium Carbonate Antacid Hives  . Prinivil (Lisinopril)  Cough    Patient Active Problem List   Diagnosis Date Noted  . Essential tremor 05/13/2012  . Palpitations 11/19/2011  . Benign hypertensive heart disease without heart failure 10/19/2010  . Pure hypercholesterolemia 10/19/2010  . Hypothyroid 10/19/2010  . Asthma 10/19/2010    History  Smoking status  . Never Smoker   Smokeless tobacco  . Not on file    History  Alcohol Use No    No family history on file.  Review of Systems: Constitutional: no fever chills diaphoresis or fatigue or change in weight.  Head and neck: no hearing loss, no epistaxis, no photophobia or visual disturbance. Respiratory: No cough, shortness of breath or wheezing. Cardiovascular: No chest pain peripheral edema, palpitations. Gastrointestinal: No abdominal distention, no abdominal pain, no change in bowel habits hematochezia or melena. Genitourinary: No dysuria, no frequency, no urgency, no nocturia. Musculoskeletal:No arthralgias, no back pain, no gait disturbance or myalgias. Neurological: No dizziness, no headaches, no numbness, no seizures, no syncope, no weakness, no tremors. Hematologic: No lymphadenopathy, no easy bruising. Psychiatric: No confusion, no hallucinations, no sleep disturbance.    Physical Exam: Filed Vitals:   05/19/12 1119  BP: 142/86  Pulse: 56   the general appearance reveals a mildly overweight woman in no distress.The head and neck exam reveals  pupils equal and reactive.  Extraocular movements are full.  There is no scleral icterus.  The mouth and pharynx are normal.  The neck is supple.  The carotids reveal no bruits.  The jugular venous pressure is normal.  The  thyroid is not enlarged.  There is no lymphadenopathy.  The chest is clear to percussion and auscultation.  There are no rales or rhonchi.  Expansion of the chest is symmetrical.  The precordium is quiet.  The first heart sound is normal.  The second heart sound is physiologically split.  There is no murmur  gallop rub or click.  There is no abnormal lift or heave.  The abdomen is soft and nontender.  The bowel sounds are normal.  The liver and spleen are not enlarged.  There are no abdominal masses.  There are no abdominal bruits.  Extremities reveal good pedal pulses.  There is no phlebitis or edema.  There is no cyanosis or clubbing.  Strength is normal and symmetrical in all extremities.  There is no lateralizing weakness.  There are no sensory deficits.  The skin is warm and dry.  There is no rash.  EKG shows sinus bradycardia and nonspecific T-wave flattening and is unchanged since 04/30/11  Assessment / Plan: Continue same medication.  Recheck in 6 months for followup office visit

## 2012-05-19 NOTE — Assessment & Plan Note (Signed)
The patient has not been experiencing any chest pain or increasing dyspnea.  She has not been aware of any palpitations or racing of her heart.  No dizziness or syncope.

## 2012-05-19 NOTE — Assessment & Plan Note (Signed)
Patient is clinically euthyroid.  Her weight is up 1 pound since last visit.

## 2012-05-19 NOTE — Assessment & Plan Note (Signed)
The patient has a history of borderline diabetes mellitus.  Her last blood sugar in our chart was 123.  Recently she had an episode of hypoglycemia at church.  She had skipped breakfast that morning.  During the service she became weak and diaphragmatic.  She was given some orange juice to drink and responded quickly.  She is not on any diabetic medication.  She is followed for her thyroid by endocrinologist Dr. Sharl Ma.

## 2012-07-22 ENCOUNTER — Other Ambulatory Visit: Payer: Self-pay | Admitting: Cardiology

## 2012-08-05 ENCOUNTER — Other Ambulatory Visit: Payer: Self-pay | Admitting: Cardiology

## 2012-08-25 ENCOUNTER — Telehealth: Payer: Self-pay | Admitting: Cardiology

## 2012-08-25 NOTE — Telephone Encounter (Signed)
New problem   Per pt she's having some problems that she discussed with her pcp and was told to call her cardiologist

## 2012-08-25 NOTE — Telephone Encounter (Signed)
Has had several episodes when she woke up in the middle of the night with her tongue feeling like it was twisted. The last episode was after July 4 th and she had numbness in her arm. When got up and was moving around numbness subsided, but soreness all day in the mouth. Patient started on Saturday having soreness in her chest that radiated to her back with some nausea. Today the soreness is there but states it is getting better. Did schedule appointment for 08/27/12 and advised to go to ED if worse, verbalized understanding.

## 2012-08-25 NOTE — Telephone Encounter (Signed)
Agree with advice given

## 2012-08-27 ENCOUNTER — Encounter: Payer: Self-pay | Admitting: Cardiology

## 2012-08-27 ENCOUNTER — Ambulatory Visit (INDEPENDENT_AMBULATORY_CARE_PROVIDER_SITE_OTHER): Payer: BC Managed Care – PPO | Admitting: Cardiology

## 2012-08-27 VITALS — BP 130/76 | HR 54 | Ht 63.5 in | Wt 215.1 lb

## 2012-08-27 DIAGNOSIS — R0609 Other forms of dyspnea: Secondary | ICD-10-CM

## 2012-08-27 DIAGNOSIS — R079 Chest pain, unspecified: Secondary | ICD-10-CM

## 2012-08-27 DIAGNOSIS — R0600 Dyspnea, unspecified: Secondary | ICD-10-CM

## 2012-08-27 DIAGNOSIS — R209 Unspecified disturbances of skin sensation: Secondary | ICD-10-CM

## 2012-08-27 DIAGNOSIS — R202 Paresthesia of skin: Secondary | ICD-10-CM | POA: Insufficient documentation

## 2012-08-27 DIAGNOSIS — I119 Hypertensive heart disease without heart failure: Secondary | ICD-10-CM

## 2012-08-27 NOTE — Progress Notes (Signed)
Betty Fox Date of Birth:  [birth year] [facility] [address] [place], [place]  [address] [phone]         Fax   [phone]  History of Present Illness: This pleasant 67 year old woman is seen for a work in office visit. She has a history of essential hypertension, hypothyroidism, dyslipidemia,and asthma. About 10 years ago she had pericarditis. She had not had any recurrent symptoms of pericarditis. She has a history of allergy to ACE inhibitors which cause a cough. He's had a past history of supraventricular tachycardia which she can abort by doing a Valsalva maneuver. He has a history of hypothyroidism followed by endocrinology. She has a history of mild diabetes. She does not have any history of ischemic heart disease. She comes in today because of 2 problems.  She states that in July she began having episodes occurring usually at night when she would awaken and her tongue would be twisted in her mouth and sometimes she would note paresthesias of her left arm there was no difficulty with speech and after she got up and walked around the symptoms would resolve.  She has had these episodes several times. Her second symptoms are precordial chest discomfort radiating to the back worse with taking a deep breath.  It began 5 days ago and is becoming a little better with each passing day.  At times she felt that she could hear her heartbeat.  She has also had more shortness of breath and has been fatigued.  She has been experiencing some night sweats.  Her weight is up 1 pound   Current Outpatient Prescriptions  Medication Sig Dispense Refill  . ALBUTEROL IN Inhale into the lungs. As needed      . amLODipine (NORVASC) 10 MG tablet Take 1 tablet (10 mg total) by mouth daily.  90 tablet  3  . atenolol (TENORMIN) 25 MG tablet TAKE 1 TABLET DAILY  90 tablet  2  . Beclomethasone Dipropionate (QVAR IN) Inhale into the lungs as needed.        . Cetirizine HCl (ZYRTEC PO)  Take by mouth as needed.        . desoximetasone (TOPICORT) 0.25 % cream Apply topically as needed.        Marland Kitchen EPIPEN 2-PAK 0.3 MG/0.3ML DEVI       . hydrochlorothiazide (HYDRODIURIL) 25 MG tablet TAKE 1 TABLET EVERY OTHER DAY OR AS DIRECTED  90 tablet  2  . levothyroxine (SYNTHROID, LEVOTHROID) 125 MCG tablet Take 125 mcg by mouth daily.        . potassium chloride (K-DUR) 10 MEQ tablet Take 1 tablet (10 mEq total) by mouth 2 (two) times daily.  180 tablet  2  . valACYclovir (VALTREX) 1000 MG tablet Take by mouth as needed.        No current facility-administered medications for this visit.    Allergies  Allergen Reactions  . Ampicillin Hives and Swelling  . Ace Inhibitors Cough  . Calcium Carbonate Antacid Hives  . Prinivil [Lisinopril] Cough    Patient Active Problem List   Diagnosis Date Noted  . Chest pain at rest 08/27/2012  . Type II or unspecified type diabetes mellitus without mention of complication, uncontrolled 05/19/2012  . Essential tremor 05/13/2012  . Palpitations 11/19/2011  . Benign hypertensive heart disease without heart failure 10/19/2010  . Pure hypercholesterolemia 10/19/2010  . Hypothyroid 10/19/2010  . Asthma 10/19/2010    History  Smoking status  . Never Smoker  Smokeless tobacco  . Not on file    History  Alcohol Use No    No family history on file.  Review of Systems: Constitutional: no fever chills diaphoresis or fatigue or change in weight.  Head and neck: no hearing loss, no epistaxis, no photophobia or visual disturbance. Respiratory: No cough, shortness of breath or wheezing. Cardiovascular: No chest pain peripheral edema, palpitations. Gastrointestinal: No abdominal distention, no abdominal pain, no change in bowel habits hematochezia or melena. Genitourinary: No dysuria, no frequency, no urgency, no nocturia. Musculoskeletal:No arthralgias, no back pain, no gait disturbance or myalgias. Neurological: No dizziness, no headaches, no  numbness, no seizures, no syncope, no weakness, no tremors. Hematologic: No lymphadenopathy, no easy bruising. Psychiatric: No confusion, no hallucinations, no sleep disturbance.    Physical Exam: Filed Vitals:   08/27/12 1620  BP: 130/76  Pulse: 54   the general appearance reveals a well-developed well-nourished woman in no distress.The head and neck exam reveals pupils equal and reactive.  Extraocular movements are full.  There is no scleral icterus.  The mouth and pharynx are normal.  The neck is supple.  Careful auscultation of the carotids reveal no bruits.  The jugular venous pressure is normal.  The  thyroid is not enlarged.  There is no lymphadenopathy.  The chest is clear to percussion and auscultation.  There are no rales or rhonchi.  Expansion of the chest is symmetrical.  The precordium is quiet.  The first heart sound is normal.  The second heart sound is physiologically split.  There is no pericardial rub.  There is no abnormal lift or heave.  The abdomen is soft and nontender.  The bowel sounds are normal.  The liver and spleen are not enlarged.  There are no abdominal masses.  There are no abdominal bruits.  Extremities reveal good pedal pulses.  There is no phlebitis or edema.  There is no cyanosis or clubbing.  Strength is normal and symmetrical in all extremities.  There is no lateralizing weakness.  There are no sensory deficits.  The skin is warm and dry.  There is no rash.   EKG shows sinus bradycardia and no ischemic changes and no changes of pericarditis.  Assessment / Plan: Continue same medication.  Proceed with evaluation of her chest pain with CBC sedimentation rate CRP chest x-ray. Proceed for evaluation for TIAs with carotid Doppler. Consider referral back to her neurologist if Dopplers are negative.

## 2012-08-27 NOTE — Assessment & Plan Note (Signed)
The recurrent episodes of paresthesias of left arm and sensation that her tongue is twisted in her mouth are somewhat unusual.  I do not hear a carotid bruit.  We will get carotid Dopplers.  If carotid Dopplers are unremarkable we will probably want her to return to see her neurologist for further evaluation of this symptom.

## 2012-08-27 NOTE — Assessment & Plan Note (Signed)
Her electrocardiogram today does not show any acute changes of acute pericarditis.  Physical exam reveals no rub.  We will get a CBC and a sedimentation rate and a high sensitivity CRP to look for evidence of inflammatory disease. We will also update her chest x-ray because of chest pain and dyspnea.

## 2012-08-27 NOTE — Assessment & Plan Note (Signed)
The patient has not had any symptoms of congestive heart failure. 

## 2012-08-27 NOTE — Patient Instructions (Addendum)
Will obtain labs today and call you with the results (cbc/sed rate/hs-crp)  Your physician has requested that you have a carotid duplex. This test is an ultrasound of the carotid arteries in your neck. It looks at blood flow through these arteries that supply the brain with blood. Allow one hour for this exam. There are no restrictions or special instructions.  Need for you to go soon to the [place] building across from [facility] for a chest Xray (between 8 am and 4 pm)  Keep your November appointment

## 2012-08-28 ENCOUNTER — Ambulatory Visit (INDEPENDENT_AMBULATORY_CARE_PROVIDER_SITE_OTHER)
Admission: RE | Admit: 2012-08-28 | Discharge: 2012-08-28 | Disposition: A | Discharge status: Home / Self Care | Payer: Medicare Other | Source: Ambulatory Visit | Attending: Cardiology | Admitting: Cardiology

## 2012-08-28 DIAGNOSIS — R079 Chest pain, unspecified: Secondary | ICD-10-CM

## 2012-08-28 DIAGNOSIS — R0989 Other specified symptoms and signs involving the circulatory and respiratory systems: Secondary | ICD-10-CM

## 2012-08-28 DIAGNOSIS — R0609 Other forms of dyspnea: Secondary | ICD-10-CM

## 2012-08-28 DIAGNOSIS — R0600 Dyspnea, unspecified: Secondary | ICD-10-CM

## 2012-08-28 LAB — CBC WITH DIFFERENTIAL/PLATELET
Basophils Absolute: 0 10*3/uL (ref 0.0–0.1)
Basophils Relative: 0.4 % (ref 0.0–3.0)
HCT: 45.1 % (ref 36.0–46.0)
Hemoglobin: 14.9 g/dL (ref 12.0–15.0)
Lymphocytes Relative: 30 % (ref 12.0–46.0)
Lymphs Abs: 2.4 10*3/uL (ref 0.7–4.0)
Monocytes Relative: 8.2 % (ref 3.0–12.0)
Neutro Abs: 4.8 10*3/uL (ref 1.4–7.7)
RBC: 4.7 Mil/uL (ref 3.87–5.11)
RDW: 13.7 % (ref 11.5–14.6)

## 2012-08-29 ENCOUNTER — Encounter: Payer: Self-pay | Admitting: Cardiology

## 2012-08-29 ENCOUNTER — Telehealth: Payer: Self-pay | Admitting: *Deleted

## 2012-08-29 ENCOUNTER — Encounter (INDEPENDENT_AMBULATORY_CARE_PROVIDER_SITE_OTHER): Payer: Medicare Other

## 2012-08-29 DIAGNOSIS — G459 Transient cerebral ischemic attack, unspecified: Secondary | ICD-10-CM

## 2012-08-29 DIAGNOSIS — R079 Chest pain, unspecified: Secondary | ICD-10-CM

## 2012-08-29 NOTE — Telephone Encounter (Signed)
Advised patient of lab results and xray

## 2012-08-29 NOTE — Telephone Encounter (Signed)
Message copied by Burnell Blanks on Fri Aug 29, 2012 12:48 PM ------      Message from: Cassell Clement      Created: Fri Aug 29, 2012  7:24 AM       Chest xray normal. ------

## 2012-08-29 NOTE — Telephone Encounter (Signed)
Message copied by Burnell Blanks on Fri Aug 29, 2012  5:36 PM ------      Message from: Cassell Clement      Created: Fri Aug 29, 2012  5:27 PM       Carotids are normal.  No blockage. Please report. ------

## 2012-08-29 NOTE — Telephone Encounter (Signed)
Advised patient

## 2012-08-29 NOTE — Telephone Encounter (Signed)
Message copied by Burnell Blanks on Fri Aug 29, 2012 12:48 PM ------      Message from: Cassell Clement      Created: Fri Aug 29, 2012  7:27 AM       Please report.  There is no sign of excessive inflammation.  CRP and sed rate and CBC all normal.CSD. Follow up with PCP if symptoms persist. ------

## 2012-09-04 ENCOUNTER — Other Ambulatory Visit: Payer: Self-pay | Admitting: Cardiology

## 2012-11-17 ENCOUNTER — Ambulatory Visit (INDEPENDENT_AMBULATORY_CARE_PROVIDER_SITE_OTHER): Payer: Medicare Other | Admitting: Cardiology

## 2012-11-17 ENCOUNTER — Encounter: Payer: Self-pay | Admitting: Cardiology

## 2012-11-17 VITALS — BP 142/80 | HR 76 | Ht 63.5 in | Wt 221.4 lb

## 2012-11-17 DIAGNOSIS — K219 Gastro-esophageal reflux disease without esophagitis: Secondary | ICD-10-CM

## 2012-11-17 DIAGNOSIS — R079 Chest pain, unspecified: Secondary | ICD-10-CM

## 2012-11-17 DIAGNOSIS — R202 Paresthesia of skin: Secondary | ICD-10-CM

## 2012-11-17 DIAGNOSIS — E039 Hypothyroidism, unspecified: Secondary | ICD-10-CM

## 2012-11-17 DIAGNOSIS — K921 Melena: Secondary | ICD-10-CM

## 2012-11-17 DIAGNOSIS — R209 Unspecified disturbances of skin sensation: Secondary | ICD-10-CM

## 2012-11-17 DIAGNOSIS — I119 Hypertensive heart disease without heart failure: Secondary | ICD-10-CM

## 2012-11-17 NOTE — Progress Notes (Signed)
Betty Fox Date of Birth:  1945-08-31 [address] [place], [place]  [address] [phone]         Fax   [phone]  History of Present Illness: This pleasant 67 year old woman is seen for a scheduled followup office visit. She has a history of essential hypertension, hypothyroidism, dyslipidemia,and asthma. About 10 years ago she had pericarditis. She had not had any recurrent symptoms of pericarditis. She has a history of allergy to ACE inhibitors which cause a cough. He's had a past history of supraventricular tachycardia which she can abort by doing a Valsalva maneuver. He has a history of hypothyroidism followed by endocrinology. She has a history of mild diabetes. She does not have any history of ischemic heart disease. Since last visit she has been diagnosed by her PCP as having acid reflux.  He told her not to drink orange juice in the morning and not to drink even decaf coffee.  He also placed her on Nexium.  She has been feeling better. She saw her dentist recently who raised the question of a possible problem with her right submandibular gland and she will be getting further evaluation of this. She has occasional slight hematochezia.  She states it her last colonoscopy was more than 10 years ago.  She will be talking with her PCP about referral to GI.  She has previously seen Dr. Lina Fox.  Current Outpatient Prescriptions  Medication Sig Dispense Refill  . ALBUTEROL IN Inhale into the lungs. As needed      . amLODipine (NORVASC) 10 MG tablet Take 1 tablet (10 mg total) by mouth daily.  90 tablet  3  . atenolol (TENORMIN) 25 MG tablet TAKE 1 TABLET DAILY  90 tablet  2  . Beclomethasone Dipropionate (QVAR IN) Inhale into the lungs as needed.        . Cetirizine HCl (ZYRTEC PO) Take by mouth as needed.        . desoximetasone (TOPICORT) 0.25 % cream Apply topically as needed.        Marland Kitchen EPIPEN 2-PAK 0.3 MG/0.3ML DEVI       . hydrochlorothiazide (HYDRODIURIL) 25  MG tablet TAKE 1 TABLET EVERY OTHER DAY OR AS DIRECTED  90 tablet  2  . KLOR-CON M10 10 MEQ tablet TAKE 1 TABLET TWICE A DAY  180 tablet  1  . levothyroxine (SYNTHROID, LEVOTHROID) 125 MCG tablet Take 125 mcg by mouth daily.        . valACYclovir (VALTREX) 1000 MG tablet Take by mouth as needed.        No current facility-administered medications for this visit.    Allergies  Allergen Reactions  . Ampicillin Hives and Swelling  . Ace Inhibitors Cough  . Calcium Carbonate Antacid Hives  . Prinivil [Lisinopril] Cough    Patient Active Problem List   Diagnosis Date Noted  . Hematochezia 11/17/2012  . Chest pain at rest 08/27/2012  . Paresthesias in left hand 08/27/2012  . Type II or unspecified type diabetes mellitus without mention of complication, uncontrolled 05/19/2012  . Essential tremor 05/13/2012  . Palpitations 11/19/2011  . Benign hypertensive heart disease without heart failure 10/19/2010  . Pure hypercholesterolemia 10/19/2010  . Hypothyroid 10/19/2010  . Asthma 10/19/2010    History  Smoking status  . Never Smoker   Smokeless tobacco  . Not on file    History  Alcohol Use No    History reviewed. No pertinent family history.  Review of Systems: Constitutional:  no fever chills diaphoresis or fatigue or change in weight.  Head and neck: no hearing loss, no epistaxis, no photophobia or visual disturbance. Respiratory: No cough, shortness of breath or wheezing. Cardiovascular: No chest pain peripheral edema, palpitations. Gastrointestinal: No abdominal distention, no abdominal pain, no change in bowel habits hematochezia or melena. Genitourinary: No dysuria, no frequency, no urgency, no nocturia. Musculoskeletal:No arthralgias, no back pain, no gait disturbance or myalgias. Neurological: No dizziness, no headaches, no numbness, no seizures, no syncope, no weakness, no tremors. Hematologic: No lymphadenopathy, no easy bruising. Psychiatric: No confusion, no  hallucinations, no sleep disturbance.    Physical Exam: Filed Vitals:   11/17/12 1014  BP: 142/80  Pulse: 76   the general appearance reveals a well-developed well-nourished woman in no distress.The head and neck exam reveals pupils equal and reactive.  Extraocular movements are full.  There is no scleral icterus.  The mouth and pharynx are normal.  The neck is supple.  Careful auscultation of the carotids reveal no bruits.  The jugular venous pressure is normal.  The  thyroid is not enlarged.  There is no lymphadenopathy.  The chest is clear to percussion and auscultation.  There are no rales or rhonchi.  Expansion of the chest is symmetrical.  The precordium is quiet.  The first heart sound is normal.  The second heart sound is physiologically split.  There is no pericardial rub.  There is no abnormal lift or heave.  The abdomen is soft and nontender.  The bowel sounds are normal.  The liver and spleen are not enlarged.  There are no abdominal masses.  There are no abdominal bruits.  Extremities reveal good pedal pulses.  There is no phlebitis or edema.  There is no cyanosis or clubbing.  Strength is normal and symmetrical in all extremities.  There is no lateralizing weakness.  There are no sensory deficits.  The skin is warm and dry.  There is no rash.     Assessment / Plan: Overall the patient is stable.  She needs to work harder on weight loss.  Continue same medication.  Continue low acid diet because of acid reflux disease.  Recheck here in 6 months for followup office visit and EKG.  She will be following up with her PCP and her gastroenterologist regarding occasional mild hematochezia.

## 2012-11-17 NOTE — Patient Instructions (Signed)
Your physician recommends that you continue on your current medications as directed. Please refer to the Current Medication list given to you today.  Your physician wants you to follow-up in: 6 month ov/ekg You will receive a reminder letter in the mail two months in advance. If you don't receive a letter, please call our office to schedule the follow-up appointment.  

## 2012-11-17 NOTE — Assessment & Plan Note (Signed)
The patient is clinically euthyroid.  She has gained 6 pounds since last visit which she attributes to being less careful with her diet.  She does not get any regular aerobic exercise.  She has been bothered recently with back pain and back spasms.

## 2012-11-17 NOTE — Assessment & Plan Note (Signed)
The patient has not had any flareup of her pericardial symptoms.  She has not had any recent chest discomfort.  She has not been aware of any palpitations

## 2012-11-17 NOTE — Assessment & Plan Note (Signed)
The patient has not been experiencing any recent chest pain.  No symptoms of CHF.  Her recent chest x-ray on 08/28/12 showed no active cardiopulmonary disease.

## 2012-11-27 ENCOUNTER — Encounter: Payer: Self-pay | Admitting: *Deleted

## 2012-12-03 ENCOUNTER — Other Ambulatory Visit: Payer: Self-pay | Admitting: Cardiology

## 2013-02-08 ENCOUNTER — Other Ambulatory Visit: Payer: Self-pay | Admitting: Cardiology

## 2013-02-10 ENCOUNTER — Encounter: Payer: Self-pay | Admitting: Internal Medicine

## 2013-02-10 ENCOUNTER — Ambulatory Visit (INDEPENDENT_AMBULATORY_CARE_PROVIDER_SITE_OTHER): Payer: Medicare Other | Admitting: Internal Medicine

## 2013-02-10 VITALS — BP 124/74 | HR 60 | Ht 63.0 in | Wt 220.0 lb

## 2013-02-10 DIAGNOSIS — K219 Gastro-esophageal reflux disease without esophagitis: Secondary | ICD-10-CM

## 2013-02-10 DIAGNOSIS — K625 Hemorrhage of anus and rectum: Secondary | ICD-10-CM

## 2013-02-10 MED ORDER — ESOMEPRAZOLE MAGNESIUM 40 MG PO CPDR: 40.0000 mg | DELAYED_RELEASE_CAPSULE | Freq: Every day | ORAL | Status: DC

## 2013-02-10 NOTE — Patient Instructions (Addendum)
You have been scheduled for an endoscopy with propofol. Please follow written instructions given to you at your visit today. If you use inhalers (even only as needed), please bring them with you on the day of your procedure. Your physician has requested that you go to www.startemmi.com and enter the access code given to you at your visit today. This web site gives a general overview about your procedure. However, you should still follow specific instructions given to you by our office regarding your preparation for the procedure.  We have sent the following medications to your pharmacy for you to pick up at your convenience: Nexium 40 mg daily  CC: Dr Mare Ferrari, Dr Orland Penman  Gastroesophageal Reflux Disease, Adult Gastroesophageal reflux disease (GERD) happens when acid from your stomach flows up into the esophagus. When acid comes in contact with the esophagus, the acid causes soreness (inflammation) in the esophagus. Over time, GERD may create small holes (ulcers) in the lining of the esophagus. CAUSES   Increased body weight. This puts pressure on the stomach, making acid rise from the stomach into the esophagus.  Smoking. This increases acid production in the stomach.  Drinking alcohol. This causes decreased pressure in the lower esophageal sphincter (valve or ring of muscle between the esophagus and stomach), allowing acid from the stomach into the esophagus.  Late evening meals and a full stomach. This increases pressure and acid production in the stomach.  A malformed lower esophageal sphincter. Sometimes, no cause is found. SYMPTOMS   Burning pain in the lower part of the mid-chest behind the breastbone and in the mid-stomach area. This may occur twice a week or more often.  Trouble swallowing.  Sore throat.  Dry cough.  Asthma-like symptoms including chest tightness, shortness of breath, or wheezing. DIAGNOSIS  Your caregiver may be able to diagnose GERD based on your  symptoms. In some cases, X-rays and other tests may be done to check for complications or to check the condition of your stomach and esophagus. TREATMENT  Your caregiver may recommend over-the-counter or prescription medicines to help decrease acid production. Ask your caregiver before starting or adding any new medicines.  HOME CARE INSTRUCTIONS   Change the factors that you can control. Ask your caregiver for guidance concerning weight loss, quitting smoking, and alcohol consumption.  Avoid foods and drinks that make your symptoms worse, such as:  Caffeine or alcoholic drinks.  Chocolate.  Peppermint or mint flavorings.  Garlic and onions.  Spicy foods.  Citrus fruits, such as oranges, lemons, or limes.  Tomato-based foods such as sauce, chili, salsa, and pizza.  Fried and fatty foods.  Avoid lying down for the 3 hours prior to your bedtime or prior to taking a nap.  Eat small, frequent meals instead of large meals.  Wear loose-fitting clothing. Do not wear anything tight around your waist that causes pressure on your stomach.  Raise the head of your bed 6 to 8 inches with wood blocks to help you sleep. Extra pillows will not help.  Only take over-the-counter or prescription medicines for pain, discomfort, or fever as directed by your caregiver.  Do not take aspirin, ibuprofen, or other nonsteroidal anti-inflammatory drugs (NSAIDs). SEEK IMMEDIATE MEDICAL CARE IF:   You have pain in your arms, neck, jaw, teeth, or back.  Your pain increases or changes in intensity or duration.  You develop nausea, vomiting, or sweating (diaphoresis).  You develop shortness of breath, or you faint.  Your vomit is green, yellow, black, or looks  like coffee grounds or blood.  Your stool is red, bloody, or black. These symptoms could be signs of other problems, such as heart disease, gastric bleeding, or esophageal bleeding. MAKE SURE YOU:   Understand these instructions.  Will  watch your condition.  Will get help right away if you are not doing well or get worse. Document Released: 10/11/2004 Document Revised: 03/26/2011 Document Reviewed: 07/21/2010 [facility] Patient Information 2014 [place], [place].  Diet for Gastroesophageal Reflux Disease, Adult Reflux (acid reflux) is when acid from your stomach flows up into the esophagus. When acid comes in contact with the esophagus, the acid causes irritation and soreness (inflammation) in the esophagus. When reflux happens often or so severely that it causes damage to the esophagus, it is called gastroesophageal reflux disease (GERD). Nutrition therapy can help ease the discomfort of GERD. FOODS OR DRINKS TO AVOID OR LIMIT  Smoking or chewing tobacco. Nicotine is one of the most potent stimulants to acid production in the gastrointestinal tract.  Caffeinated and decaffeinated coffee and black tea.  Regular or low-calorie carbonated beverages or energy drinks (caffeine-free carbonated beverages are allowed).   Strong spices, such as black pepper, white pepper, red pepper, cayenne, curry powder, and chili powder.  Peppermint or spearmint.  Chocolate.  High-fat foods, including meats and fried foods. Extra added fats including oils, butter, salad dressings, and nuts. Limit these to less than 8 tsp per day.  Fruits and vegetables if they are not tolerated, such as citrus fruits or tomatoes.  Alcohol.  Any food that seems to aggravate your condition. If you have questions regarding your diet, call your caregiver or a registered dietitian. OTHER THINGS THAT MAY HELP GERD INCLUDE:   Eating your meals slowly, in a relaxed setting.  Eating 5 to 6 small meals per day instead of 3 large meals.  Eliminating food for a period of time if it causes distress.  Not lying down until 3 hours after eating a meal.  Keeping the head of your bed raised 6 to 9 inches (15 to 23 cm) by using a foam wedge or blocks under the legs of  the bed. Lying flat may make symptoms worse.  Being physically active. Weight loss may be helpful in reducing reflux in overweight or obese adults.  Wear loose fitting clothing EXAMPLE MEAL PLAN This meal plan is approximately 2,000 calories based on [email or web address] meal planning guidelines. Breakfast   cup cooked oatmeal.  1 cup strawberries.  1 cup low-fat milk.  1 oz almonds. Snack  1 cup cucumber slices.  6 oz yogurt (made from low-fat or fat-free milk). Lunch  2 slice whole-wheat bread.  2 oz sliced [place].  2 tsp mayonnaise.  1 cup blueberries.  1 cup snap peas. Snack  6 whole-wheat crackers.  1 oz string cheese. Dinner   cup brown rice.  1 cup mixed veggies.  1 tsp olive oil.  3 oz grilled fish. Document Released: 01/01/2005 Document Revised: 03/26/2011 Document Reviewed: 11/17/2010 [facility] Patient Information 2014 [place], [place].

## 2013-02-10 NOTE — Progress Notes (Signed)
Betty Fox 1945-07-07 [id number]  Note: This dictation was prepared with Dragon digital system. Any transcriptional errors that result from this procedure are unintentional.   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 She describes burning substernally during the day as well as at night. She also chokes on liquids but not on solids. She had an upper endoscopy in July 1997 for gastroesophageal reflux and the exam was normal. She has gained about 25 pounds recently. She does not drink alcohol or take anti-inflammatory agents. She has a history of endometriosis. Her last colonoscopy in August 2005 showed focal colitis with neutrophilic infiltrate at 30 cm and it was consistent with nonspecific proctosigmoiditis. She has been constipated. Juice and Metamucil seem to regulate her bowel habits. She is noticed small-volume painless rectal bleeding    Past Medical History  Diagnosis Date  . Hypertension   . Hyperlipidemia   . Asthma   . Hypothyroidism   . Pericarditis   . PSVT (paroxysmal supraventricular tachycardia)   . Uveitis   . Dyspepsia   . Hashimoto's thyroiditis   . Osteopenia   . Nephrolithiasis   . Vitamin D deficiency   . Arthritis   . Diverticulosis   . Pneumonia   . Allergic rhinitis   . Eczema   . Shingles     Past Surgical History  Procedure Laterality Date  . Bladder repair    . US echocardiography  07/27/2009    EF 55-60%  . Laparoscopic endometriosis fulguration    . Tubal ligation    . Breast reduction surgery Bilateral     Allergies  Allergen Reactions  . Ampicillin Hives and Swelling  . Ace Inhibitors Cough  . Calcium Carbonate Antacid Hives  . Prinivil [Lisinopril] Cough    Family history and social history have been reviewed.  Review of Systems: Occasional choking on liquids. Subsequently burning pain and constipation  The remainder of the  10 point ROS is negative except as outlined in the H&P  Physical Exam: General Appearance Well developed, in no distress, overweight Eyes  Non icteric  HEENT  Non traumatic, normocephalic  Mouth No lesion, tongue papillated, no cheilosis Neck Supple without adenopathy, thyroid not enlarged, no carotid bruits, no JVD Lungs Clear to auscultation bilaterally COR Normal S1, normal S2, regular rhythm, no murmur, quiet precordium Abdomen large protuberant very tender across the epigastrium. No rebound. Difficult exam due to obesity. Lower abdomen unremarkable. Rectal soft Hemoccult negative stool Extremities  No pedal edema Skin No lesions Neurological Alert and oriented x 3 Psychological Normal mood and affect  Assessment and Plan:   Problem #1 Progressive gastroesophageal reflux. This is likely from a combination of stress, weight gain and possible esophageal dysmotility. We will start her on Nexium 40 mg daily. She has been on 20 mg on an as necessary basis. We have instructed her on antireflux measures. We will plan to rule out Barrett's esophagus with upper endoscopy.  Problem #2 Colorectal screening. Patient's last colonoscopy was in August 2005. She will be due for a recall colonoscopy in August 2015. She will continue on a high fiber diet with fiber supplements. Low-volume rectal bleeding with constipation. If it continues we will do colonoscopy earlier    Delfin Edis 02/10/2013

## 2013-02-11 ENCOUNTER — Encounter: Payer: Self-pay | Admitting: Internal Medicine

## 2013-02-11 ENCOUNTER — Ambulatory Visit (AMBULATORY_SURGERY_CENTER): Payer: Medicare Other | Admitting: Internal Medicine

## 2013-02-11 ENCOUNTER — Telehealth: Payer: Self-pay | Admitting: Internal Medicine

## 2013-02-11 VITALS — BP 140/63 | HR 67 | Temp 97.3°F | Resp 22 | Ht 63.0 in | Wt 220.0 lb

## 2013-02-11 DIAGNOSIS — K2100 Gastro-esophageal reflux disease with esophagitis, without bleeding: Secondary | ICD-10-CM

## 2013-02-11 DIAGNOSIS — K219 Gastro-esophageal reflux disease without esophagitis: Secondary | ICD-10-CM

## 2013-02-11 DIAGNOSIS — K209 Esophagitis, unspecified without bleeding: Secondary | ICD-10-CM

## 2013-02-11 DIAGNOSIS — K3189 Other diseases of stomach and duodenum: Secondary | ICD-10-CM

## 2013-02-11 DIAGNOSIS — K2990 Gastroduodenitis, unspecified, without bleeding: Secondary | ICD-10-CM

## 2013-02-11 DIAGNOSIS — K319 Disease of stomach and duodenum, unspecified: Secondary | ICD-10-CM

## 2013-02-11 DIAGNOSIS — K2970 Gastritis, unspecified, without bleeding: Secondary | ICD-10-CM

## 2013-02-11 MED ORDER — SODIUM CHLORIDE 0.9 % IV SOLN: 500.0000 mL | INTRAVENOUS | Status: DC

## 2013-02-11 MED ORDER — SUCRALFATE 1 GM/10ML PO SUSP: 1.0000 g | Freq: Two times a day (BID) | ORAL | Status: DC

## 2013-02-11 NOTE — Telephone Encounter (Signed)
Pt. Called after talking with doctor Olevia Perches and physician instructed pt. To use pepto bismol one TSP twice a day for a few days just to coat her esophagitis. Pt.informed that she can buy that over the counter. Pt. Verbalize understanding.

## 2013-02-11 NOTE — Patient Instructions (Signed)

## 2013-02-11 NOTE — Op Note (Signed)
[place]  [address]. [place], [address]   ENDOSCOPY PROCEDURE REPORT  PATIENT: [name], [name]  MR#: [id number] BIRTHDATE: Oct 02, 1945 , 67  yrs. old GENDER: Female ENDOSCOPIST: Lafayette Dragon, MD REFERRED BY:  Darlin Coco, M.D. PROCEDURE DATE:  02/11/2013 PROCEDURE:  EGD w/ biopsy ASA CLASS:     Class II INDICATIONS:  Epigastric pain.   Heartburn.   Dysphagia. MEDICATIONS: MAC sedation, administered by CRNA and Propofol (Diprivan) TOPICAL ANESTHETIC: none  DESCRIPTION OF PROCEDURE: After the risks benefits and alternatives of the procedure were thoroughly explained, informed consent was obtained.  The LB [id number] endoscope was introduced through the mouth and advanced to the second portion of the duodenum. Without limitations.  The instrument was slowly withdrawn as the mucosa was fully examined.      esophagus: Proximal and mid and mid esophageal mucosa appeared normal, there was a grade 1 esophagitis at the GE junction. 1 short erosion measured  5 mm sur rounded by fibrous tissue. No stricture. Z line was slightly irregular.biopsies taken Stomach: There was a small reducible hiatal hernia measuring 1-2 cm. Gastric folds were normal. There were 2 submucosal nodules in the gastric antrum one measured 7 mm to 1 about 10 mm. Biopsies were taken there was no umbilication. Gastric outlet was normal. Retroflexion of the scope revealed normal fundus and cardia Duodenum: Duodenal bulb and descending duodenum was normal[ The scope was then withdrawn from the patient and the procedure completed.  COMPLICATIONS: There were no complications. ENDOSCOPIC IMPRESSION: grade 1 esophagitis status post biopsies Regular Z line rule out Barrett's esophagus Small sliding hiatal hernia 1-2 cm 2 submucosal nodules in the gastric antrum status post biopsies reliably of no clinical significance  RECOMMENDATIONS:  Await pathology results Strict  antireflux measures Increase acid suppression and add Carafate 1 g twice a day REPEAT EXAM: for EGD pending biopsy results.  eSigned:  Lafayette Dragon, MD 02/11/2013 10:28 AM   CC:  PATIENT NAME:  Betty Fox, Betty Fox MR#: [id number]

## 2013-02-11 NOTE — Progress Notes (Signed)
Stable to RR 

## 2013-02-11 NOTE — Progress Notes (Signed)
Called to room to assist during endoscopic procedure.  Patient ID and intended procedure confirmed with present staff. Received instructions for my participation in the procedure from the performing physician.  

## 2013-02-12 ENCOUNTER — Telehealth: Payer: Self-pay | Admitting: *Deleted

## 2013-02-12 NOTE — Telephone Encounter (Signed)
  Follow up Call-  Call back number 02/11/2013  Post procedure Call Back phone  # [phone]  Permission to leave phone message Yes     Patient questions:  Do you have a fever, pain , or abdominal swelling? no Pain Score  0 *  Have you tolerated food without any problems? yes  Have you been able to return to your normal activities? yes  Do you have any questions about your discharge instructions: Diet   no Medications  no Follow up visit  no  Do you have questions or concerns about your Care? no  Actions: * If pain score is 4 or above: No action needed, pain <4.

## 2013-02-17 ENCOUNTER — Other Ambulatory Visit: Payer: Self-pay | Admitting: *Deleted

## 2013-02-17 MED ORDER — POTASSIUM CHLORIDE CRYS ER 10 MEQ PO TBCR: EXTENDED_RELEASE_TABLET | ORAL | Status: DC

## 2013-02-23 ENCOUNTER — Telehealth: Payer: Self-pay | Admitting: Cardiology

## 2013-02-23 NOTE — Telephone Encounter (Signed)
New Problem:  Pt states she is returning a call from this number and believes it was [place].

## 2013-02-23 NOTE — Telephone Encounter (Signed)
Spoke with patient and she stated she found out another office had called her

## 2013-02-24 ENCOUNTER — Encounter: Payer: Self-pay | Admitting: Internal Medicine

## 2013-02-24 ENCOUNTER — Encounter: Payer: Self-pay | Admitting: *Deleted

## 2013-03-09 ENCOUNTER — Other Ambulatory Visit: Payer: Self-pay | Admitting: Internal Medicine

## 2013-04-07 ENCOUNTER — Other Ambulatory Visit: Payer: Self-pay | Admitting: *Deleted

## 2013-04-07 MED ORDER — AMLODIPINE BESYLATE 10 MG PO TABS: ORAL_TABLET | ORAL | Status: DC

## 2013-04-21 ENCOUNTER — Other Ambulatory Visit: Payer: Self-pay | Admitting: *Deleted

## 2013-04-21 MED ORDER — ATENOLOL 25 MG PO TABS: ORAL_TABLET | ORAL | Status: DC

## 2013-04-21 MED ORDER — HYDROCHLOROTHIAZIDE 25 MG PO TABS: ORAL_TABLET | ORAL | Status: DC

## 2013-04-21 MED ORDER — LEVOTHYROXINE SODIUM 125 MCG PO TABS: 125.0000 ug | ORAL_TABLET | Freq: Every day | ORAL | Status: DC

## 2013-05-20 ENCOUNTER — Encounter: Payer: Self-pay | Admitting: Cardiology

## 2013-05-20 ENCOUNTER — Ambulatory Visit (INDEPENDENT_AMBULATORY_CARE_PROVIDER_SITE_OTHER): Payer: Medicare Other | Admitting: Cardiology

## 2013-05-20 VITALS — BP 138/72 | HR 48 | Ht 63.0 in | Wt 212.8 lb

## 2013-05-20 DIAGNOSIS — R0600 Dyspnea, unspecified: Secondary | ICD-10-CM

## 2013-05-20 DIAGNOSIS — R0989 Other specified symptoms and signs involving the circulatory and respiratory systems: Secondary | ICD-10-CM

## 2013-05-20 DIAGNOSIS — K219 Gastro-esophageal reflux disease without esophagitis: Secondary | ICD-10-CM

## 2013-05-20 DIAGNOSIS — I119 Hypertensive heart disease without heart failure: Secondary | ICD-10-CM

## 2013-05-20 DIAGNOSIS — R0609 Other forms of dyspnea: Secondary | ICD-10-CM

## 2013-05-20 DIAGNOSIS — E1165 Type 2 diabetes mellitus with hyperglycemia: Secondary | ICD-10-CM

## 2013-05-20 DIAGNOSIS — E039 Hypothyroidism, unspecified: Secondary | ICD-10-CM

## 2013-05-20 DIAGNOSIS — IMO0001 Reserved for inherently not codable concepts without codable children: Secondary | ICD-10-CM

## 2013-05-20 NOTE — Assessment & Plan Note (Addendum)
She is now on low dose metformin and tolerating it without side effects.  No hypoglycemic symptoms.

## 2013-05-20 NOTE — Assessment & Plan Note (Signed)
Blood pressure has been remaining stable on current medication.  Her energy level has improved since she has been exercising more regularly and has lost weight.

## 2013-05-20 NOTE — Assessment & Plan Note (Signed)
She remains clinically euthyroid

## 2013-05-20 NOTE — Patient Instructions (Addendum)
Your physician recommends that you continue on your current medications as directed. Please refer to the Current Medication list given to you today.  Call with your Lipitor (Atorvastatin) and Metformin dosage, [phone] ask for Drain physician wants you to follow-up in: 6 month ov You will receive a reminder letter in the mail two months in advance. If you don't receive a letter, please call our office to schedule the follow-up appointment.

## 2013-05-20 NOTE — Progress Notes (Signed)
Betty Fox Date of Birth:  [birth year] [place] [address] [place], [place]  [address] [phone]        Fax   [phone]   History of Present Illness: This pleasant 68 year old woman is seen for a scheduled followup office visit. She has a history of essential hypertension, hypothyroidism, dyslipidemia,and asthma. About 10 years ago she had pericarditis. She had not had any recurrent symptoms of pericarditis. She has a history of allergy to ACE inhibitors which cause a cough. He's had a past history of supraventricular tachycardia which she can abort by doing a Valsalva maneuver. He has a history of hypothyroidism followed by endocrinology. She has a history of mild diabetes. She does not have any history of ischemic heart disease.  Since last visit she has been exercising more.  She has lost 9 pounds.  She has been using any new step machine and also walking laps at the gym. She is a borderline diabetic and her PCP has recently started her on metformin.  She is also back on Lipitor every other day uncertain dose.    Current Outpatient Prescriptions  Medication Sig Dispense Refill  . ALBUTEROL IN Inhale into the lungs. As needed      . amLODipine (NORVASC) 10 MG tablet TAKE 1 TABLET DAILY  90 tablet  0  . atenolol (TENORMIN) 25 MG tablet TAKE 1 TABLET DAILY  90 tablet  2  . Atorvastatin Calcium (LIPITOR PO) Take by mouth every other day.      . Beclomethasone Dipropionate (QVAR IN) Inhale into the lungs as needed.        . Cetirizine HCl (ZYRTEC PO) Take by mouth as needed.        . Cholecalciferol (VITAMIN D) 2000 UNITS tablet Take 2,000 Units by mouth daily.      Marland Kitchen desoximetasone (TOPICORT) 0.25 % cream Apply topically as needed.        . hydrochlorothiazide (HYDRODIURIL) 25 MG tablet TAKE 1 TABLET EVERY OTHER DAY OR AS DIRECTED  90 tablet  2  . levothyroxine (SYNTHROID, LEVOTHROID) 125 MCG tablet Take 1 tablet (125 mcg total) by mouth daily.  90  tablet  1  . metFORMIN (GLUCOPHAGE) 500 MG tablet Take by mouth daily with breakfast.      . NEXIUM 40 MG capsule TAKE ONE CAPSULE BY MOUTH DAILY AT 12 NOON  30 capsule  3  . NON FORMULARY ALLERGY SHOT ONCE EVERY MONTH      . potassium chloride (KLOR-CON M10) 10 MEQ tablet TAKE 1 TABLET TWICE A DAY  180 tablet  1  . sucralfate (CARAFATE) 1 GM/10ML suspension Take 10 mLs (1 g total) by mouth 2 (two) times daily.  420 mL  1  . valACYclovir (VALTREX) 1000 MG tablet Take by mouth as needed.        No current facility-administered medications for this visit.    Allergies  Allergen Reactions  . Ampicillin Hives and Swelling  . Ace Inhibitors Cough  . Calcium Carbonate Antacid Hives  . Prinivil [Lisinopril] Cough    Patient Active Problem List   Diagnosis Date Noted  . Hematochezia 11/17/2012  . Chest pain at rest 08/27/2012  . Paresthesias in left hand 08/27/2012  . Type II or unspecified type diabetes mellitus without mention of complication, uncontrolled 05/19/2012  . Essential tremor 05/13/2012  . Palpitations 11/19/2011  . Benign hypertensive heart disease without heart failure 10/19/2010  . Pure hypercholesterolemia 10/19/2010  . Hypothyroid  10/19/2010  . Asthma 10/19/2010    History  Smoking status  . Never Smoker   Smokeless tobacco  . Never Used    History  Alcohol Use  . Yes    Comment: 1 per year    Family History  Problem Relation Age of Onset  . Adopted: Yes    Review of Systems: Constitutional: no fever chills diaphoresis or fatigue or change in weight.  Head and neck: no hearing loss, no epistaxis, no photophobia or visual disturbance. Respiratory: No cough, shortness of breath or wheezing. Cardiovascular: No chest pain peripheral edema, palpitations. Gastrointestinal: No abdominal distention, no abdominal pain, no change in bowel habits hematochezia or melena. Genitourinary: No dysuria, no frequency, no urgency, no nocturia. Musculoskeletal:No  arthralgias, no back pain, no gait disturbance or myalgias. Neurological: No dizziness, no headaches, no numbness, no seizures, no syncope, no weakness, no tremors. Hematologic: No lymphadenopathy, no easy bruising. Psychiatric: No confusion, no hallucinations, no sleep disturbance.    Physical Exam: Filed Vitals:   05/20/13 1531  BP: 138/72  Pulse: 48   the general appearance reveals a well-developed well-nourished woman in no distress.The head and neck exam reveals pupils equal and reactive.  Extraocular movements are full.  There is no scleral icterus.  The mouth and pharynx are normal.  The neck is supple.  The carotids reveal no bruits.  The jugular venous pressure is normal.  The  thyroid is not enlarged.  There is no lymphadenopathy.  The chest is clear to percussion and auscultation.  There are no rales or rhonchi.  Expansion of the chest is symmetrical.  The precordium is quiet.  The first heart sound is normal.  The second heart sound is physiologically split.  There is no murmur gallop rub or click.  There is no abnormal lift or heave.  The abdomen is soft and nontender.  The bowel sounds are normal.  The liver and spleen are not enlarged.  There are no abdominal masses.  There are no abdominal bruits.  Extremities reveal good pedal pulses.  There is no phlebitis or edema.  There is no cyanosis or clubbing.  Strength is normal and symmetrical in all extremities.  There is no lateralizing weakness.  There are no sensory deficits.  The skin is warm and dry.  There is a patch of neurodermatitis on the lateral aspect of her left ankle.  EKG shows marked sinus bradycardia with nonspecific T-wave abnormalities unchanged since 08/27/12  Assessment / Plan:  1. benign hypertensive heart disease without heart failure 2. acid reflux disease 3. Hypothyroidism 4. history of asthma  Plan: Currently she is doing well.  Continue same medication.  Continue weight loss and regular exercise.

## 2013-07-04 ENCOUNTER — Other Ambulatory Visit: Payer: Self-pay | Admitting: Cardiology

## 2013-08-13 ENCOUNTER — Encounter: Payer: Self-pay | Admitting: Cardiology

## 2013-08-26 ENCOUNTER — Other Ambulatory Visit: Payer: Self-pay | Admitting: Gynecology

## 2013-08-27 LAB — CYTOLOGY - PAP

## 2013-10-08 ENCOUNTER — Other Ambulatory Visit: Payer: Self-pay | Admitting: Gynecology

## 2013-10-09 ENCOUNTER — Other Ambulatory Visit: Payer: Self-pay | Admitting: Cardiology

## 2013-10-11 ENCOUNTER — Other Ambulatory Visit: Payer: Self-pay | Admitting: Cardiology

## 2013-10-13 ENCOUNTER — Other Ambulatory Visit: Payer: Self-pay | Admitting: Cardiology

## 2013-10-16 ENCOUNTER — Other Ambulatory Visit: Payer: Self-pay | Admitting: Gynecology

## 2013-10-16 DIAGNOSIS — N950 Postmenopausal bleeding: Secondary | ICD-10-CM

## 2013-10-26 ENCOUNTER — Ambulatory Visit
Admission: RE | Admit: 2013-10-26 | Discharge: 2013-10-26 | Disposition: A | Discharge status: Home / Self Care | Payer: 59 | Source: Ambulatory Visit | Attending: Gynecology | Admitting: Gynecology

## 2013-10-26 DIAGNOSIS — N950 Postmenopausal bleeding: Secondary | ICD-10-CM

## 2013-10-26 MED ORDER — GADOBENATE DIMEGLUMINE 529 MG/ML IV SOLN
20.0000 mL | Freq: Once | INTRAVENOUS | Status: AC | PRN
Start: 2013-10-26 — End: 2013-10-26
  Administered 2013-10-26: 20 mL via INTRAVENOUS

## 2013-11-18 ENCOUNTER — Other Ambulatory Visit: Payer: Self-pay | Admitting: Family Medicine

## 2013-11-18 DIAGNOSIS — M519 Unspecified thoracic, thoracolumbar and lumbosacral intervertebral disc disorder: Secondary | ICD-10-CM

## 2013-12-08 ENCOUNTER — Ambulatory Visit
Admission: RE | Admit: 2013-12-08 | Discharge: 2013-12-08 | Disposition: A | Discharge status: Home / Self Care | Payer: 59 | Source: Ambulatory Visit | Attending: Family Medicine | Admitting: Family Medicine

## 2013-12-08 DIAGNOSIS — M519 Unspecified thoracic, thoracolumbar and lumbosacral intervertebral disc disorder: Secondary | ICD-10-CM

## 2014-01-05 ENCOUNTER — Other Ambulatory Visit: Payer: Self-pay | Admitting: Cardiology

## 2014-01-09 ENCOUNTER — Other Ambulatory Visit: Payer: Self-pay | Admitting: Cardiology

## 2014-01-12 ENCOUNTER — Other Ambulatory Visit: Payer: Self-pay | Admitting: Cardiology

## 2014-02-02 ENCOUNTER — Other Ambulatory Visit: Payer: Self-pay | Admitting: Cardiology

## 2014-02-08 ENCOUNTER — Other Ambulatory Visit: Payer: Self-pay | Admitting: Cardiology

## 2014-02-09 ENCOUNTER — Other Ambulatory Visit: Payer: Self-pay | Admitting: Cardiology

## 2014-03-03 ENCOUNTER — Other Ambulatory Visit: Payer: Self-pay | Admitting: Cardiology

## 2014-04-10 ENCOUNTER — Other Ambulatory Visit: Payer: Self-pay | Admitting: Cardiology

## 2014-04-23 ENCOUNTER — Other Ambulatory Visit: Payer: Self-pay | Admitting: Cardiology

## 2014-05-09 ENCOUNTER — Other Ambulatory Visit: Payer: Self-pay | Admitting: Cardiology

## 2014-05-10 ENCOUNTER — Other Ambulatory Visit: Payer: Self-pay | Admitting: *Deleted

## 2014-05-10 MED ORDER — ATENOLOL 25 MG PO TABS: 25.0000 mg | ORAL_TABLET | Freq: Every day | ORAL | Status: DC

## 2014-05-31 ENCOUNTER — Encounter: Payer: Self-pay | Admitting: Cardiology

## 2014-05-31 ENCOUNTER — Ambulatory Visit (INDEPENDENT_AMBULATORY_CARE_PROVIDER_SITE_OTHER): Payer: Medicare Other | Admitting: Cardiology

## 2014-05-31 VITALS — BP 140/68 | HR 52 | Ht 64.0 in | Wt 196.8 lb

## 2014-05-31 DIAGNOSIS — R0600 Dyspnea, unspecified: Secondary | ICD-10-CM | POA: Diagnosis not present

## 2014-05-31 DIAGNOSIS — I119 Hypertensive heart disease without heart failure: Secondary | ICD-10-CM | POA: Diagnosis not present

## 2014-05-31 DIAGNOSIS — E039 Hypothyroidism, unspecified: Secondary | ICD-10-CM

## 2014-05-31 NOTE — Progress Notes (Addendum)
Cardiology Office Note   Date:  05/31/2014   ID:  Betty Fox, DOB May 09, 1945, MRN [id number]  PCP:  Gavin Pound, MD  Cardiologist: Darlin Coco MD  No chief complaint on file.     History of Present Illness: Betty Fox is a 69 y.o. female who presents for a one-year follow-up office visit  This pleasant 69 year old woman is seen for a scheduled followup office visit. She has a history of essential hypertension, hypothyroidism, dyslipidemia,and asthma. About 10 years ago she had pericarditis. She had not had any recurrent symptoms of pericarditis. She has a history of allergy to ACE inhibitors which cause a cough. He's had a past history of supraventricular tachycardia which she can abort by doing a Valsalva maneuver. He has a history of hypothyroidism followed by endocrinology.  She has been diagnosed as Hashimoto's thyroiditis.  Dr. Buddy Duty is her endocrinologist.  She has a history of mild diabetes. She does not have any history of ischemic heart disease.  Since last visit she has been exercising more. She has lost 16 pounds since last year. She has been using any new step machine and also walking laps at the gym. She is a borderline diabetic and her PCP has recently started her on metformin. She is also back on Lipitor every other day uncertain dose. She has not been experiencing any chest pain.  She has not been having any palpitations.  Her dyspnea has improved since she lost weight.  Past Medical History  Diagnosis Date  . Hypertension   . Hyperlipidemia   . Asthma   . Hypothyroidism   . Pericarditis   . PSVT (paroxysmal supraventricular tachycardia)   . Uveitis   . Dyspepsia   . Hashimoto's thyroiditis   . Osteopenia   . Nephrolithiasis   . Vitamin D deficiency   . Arthritis   . Diverticulosis   . Pneumonia   . Allergic rhinitis   . Eczema   . Shingles     Past Surgical History  Procedure Laterality Date  . Bladder repair    . US echocardiography   07/27/2009    EF 55-60%  . Laparoscopic endometriosis fulguration    . Tubal ligation    . Breast reduction surgery Bilateral      Current Outpatient Prescriptions  Medication Sig Dispense Refill  . ALBUTEROL IN Inhale 1 puff into the lungs daily as needed (for wheezing or shortness of breath). As needed    . amLODipine (NORVASC) 10 MG tablet TAKE 1 TABLET BY MOUTH ONCE DAILY (NEED APPT) 30 tablet 06  . atenolol (TENORMIN) 25 MG tablet Take 1 tablet (25 mg total) by mouth daily. 30 tablet 0  . atorvastatin (LIPITOR) 10 MG tablet Take 10 mg by mouth daily.    . Beclomethasone Dipropionate (QVAR IN) Inhale 2 puffs into the lungs daily.     . cetirizine (ZYRTEC) 10 MG tablet Take 10 mg by mouth daily as needed for allergies (for allergies).    . Cholecalciferol (VITAMIN D) 2000 UNITS tablet Take 2,000 Units by mouth daily.    Marland Kitchen desoximetasone (TOPICORT) 0.25 % cream Apply 1 application topically as needed (for exzema).     . hydrochlorothiazide (HYDRODIURIL) 25 MG tablet Take 25 mg by mouth daily.    Marland Kitchen KLOR-CON 10 10 MEQ tablet TAKE 1 TABLET BY MOUTH TWICE A DAY 180 tablet 0  . metFORMIN (GLUCOPHAGE) 500 MG tablet Take 500 mg by mouth daily with breakfast.     .  sucralfate (CARAFATE) 1 GM/10ML suspension Take 10 mLs (1 g total) by mouth 2 (two) times daily. 420 mL 1  . SYNTHROID 100 MCG tablet Take 1 tablet by mouth daily.  5  . valACYclovir (VALTREX) 1000 MG tablet Take 1,000 mg by mouth as needed (for shingles).      No current facility-administered medications for this visit.    Allergies:   Ampicillin; Ace inhibitors; Calcium carbonate antacid; and Prinivil    Social History:  The patient  reports that she has never smoked. She has never used smokeless tobacco. She reports that she drinks alcohol. She reports that she does not use illicit drugs.   Family History:  The patient's family history is not on file. She was adopted.    ROS:  Please see the history of present illness.    Otherwise, review of systems are positive for none.   All other systems are reviewed and negative.    PHYSICAL EXAM: VS:  BP 140/68 mmHg  Pulse 52  Ht 5\' 4"  (1.626 m)  Wt 196 lb 12.8 oz (89.268 kg)  BMI 33.76 kg/m2 , BMI Body mass index is 33.76 kg/(m^2). GEN: Well nourished, well developed, in no acute distress HEENT: normal Neck: no JVD, carotid bruits, or masses Cardiac: RRR; no murmurs, rubs, or gallops,no edema  Respiratory:  clear to auscultation bilaterally, normal work of breathing GI: soft, nontender, nondistended, + BS MS: no deformity or atrophy Skin: warm and dry, no rash Neuro:  Strength and sensation are intact Psych: euthymic mood, full affect   EKG:  EKG is ordered today. The ekg ordered today demonstrates sinus bradycardia and nonspecific T-wave flattening.  Since prior tracing of 05/20/13, no significant change.   Recent Labs: No results found for requested labs within last 365 days.    Lipid Panel No results found for: CHOL, TRIG, HDL, CHOLHDL, VLDL, LDLCALC, LDLDIRECT    Wt Readings from Last 3 Encounters:  05/31/14 196 lb 12.8 oz (89.268 kg)  05/20/13 212 lb 12.8 oz (96.525 kg)  02/11/13 220 lb (99.791 kg)        ASSESSMENT AND PLAN:   1. benign hypertensive heart disease without heart failure 2. acid reflux disease 3. Hypothyroidism Hashimoto's 4. history of asthma 5.  Diabetes mellitus type 2  Plan: Currently she is doing well. Continue same medication. Continue weight loss and regular exercise.  In one year for office visit and CBC.  She has a new PCP, Dr. Sabra Heck.   Current medicines are reviewed at length with the patient today.  The patient does not have concerns regarding medicines.  The following changes have been made:  no change  Labs/ tests ordered today include:  No orders of the defined types were placed in this encounter.     Berna Spare MD 05/31/2014 10:23 AM    [facility] [place], [place], [place]  [address] Phone: [phone]; Fax: [phone]

## 2014-05-31 NOTE — Patient Instructions (Signed)
Medication Instructions:  Your physician recommends that you continue on your current medications as directed. Please refer to the Current Medication list given to you today.  Labwork: NONE  Testing/Procedures: NONE  Follow-Up: Your physician wants you to follow-up in: [place] will receive a reminder letter in the mail two months in advance. If you don't receive a letter, please call our office to schedule the follow-up appointment.   Any Other Special Instructions Will Be Listed Below (If Applicable).

## 2014-06-05 ENCOUNTER — Other Ambulatory Visit: Payer: Self-pay | Admitting: Cardiology

## 2014-06-08 ENCOUNTER — Other Ambulatory Visit: Payer: Self-pay | Admitting: Cardiology

## 2014-06-16 HISTORY — PX: MOUTH SURGERY: SHX715

## 2014-06-18 ENCOUNTER — Other Ambulatory Visit: Payer: Self-pay

## 2014-06-18 MED ORDER — ATENOLOL 25 MG PO TABS: 25.0000 mg | ORAL_TABLET | Freq: Every day | ORAL | Status: DC

## 2014-06-21 ENCOUNTER — Encounter: Payer: Self-pay | Admitting: Internal Medicine

## 2014-07-23 ENCOUNTER — Other Ambulatory Visit: Payer: Self-pay | Admitting: *Deleted

## 2014-07-23 MED ORDER — POTASSIUM CHLORIDE ER 10 MEQ PO TBCR: 10.0000 meq | EXTENDED_RELEASE_TABLET | Freq: Two times a day (BID) | ORAL | Status: DC

## 2014-08-06 ENCOUNTER — Ambulatory Visit (AMBULATORY_SURGERY_CENTER): Payer: Self-pay | Admitting: *Deleted

## 2014-08-06 VITALS — Ht 63.0 in | Wt 191.8 lb

## 2014-08-06 DIAGNOSIS — Z1211 Encounter for screening for malignant neoplasm of colon: Secondary | ICD-10-CM

## 2014-08-06 MED ORDER — NA SULFATE-K SULFATE-MG SULF 17.5-3.13-1.6 GM/177ML PO SOLN
1.0000 | Freq: Once | ORAL | Status: DC
Start: 2014-08-06 — End: 2014-08-20

## 2014-08-06 NOTE — Progress Notes (Signed)
Denies allergies to eggs or soy products. Denies complications with sedation or anesthesia. Denies O2 use. Denies use of diet or weight loss medications.  Emmi instructions given for colonoscopy.  

## 2014-08-20 ENCOUNTER — Encounter: Payer: Medicare Other | Admitting: Internal Medicine

## 2014-08-20 ENCOUNTER — Encounter: Payer: Self-pay | Admitting: Gastroenterology

## 2014-08-20 ENCOUNTER — Ambulatory Visit (AMBULATORY_SURGERY_CENTER): Payer: Medicare Other | Admitting: Gastroenterology

## 2014-08-20 VITALS — BP 127/66 | HR 54 | Temp 97.4°F | Resp 27 | Ht 63.0 in | Wt 191.0 lb

## 2014-08-20 DIAGNOSIS — D124 Benign neoplasm of descending colon: Secondary | ICD-10-CM

## 2014-08-20 DIAGNOSIS — Z1211 Encounter for screening for malignant neoplasm of colon: Secondary | ICD-10-CM | POA: Diagnosis not present

## 2014-08-20 LAB — GLUCOSE, CAPILLARY
Glucose-Capillary: 112 mg/dL — ABNORMAL HIGH (ref 65–99)
Glucose-Capillary: 101 mg/dL — ABNORMAL HIGH (ref 65–99)

## 2014-08-20 MED ORDER — SODIUM CHLORIDE 0.9 % IV SOLN: 500.0000 mL | INTRAVENOUS | Status: DC

## 2014-08-20 NOTE — Progress Notes (Signed)
Called to room to assist during endoscopic procedure.  Patient ID and intended procedure confirmed with present staff. Received instructions for my participation in the procedure from the performing physician.  

## 2014-08-20 NOTE — Progress Notes (Signed)
To recovery, report to Scott, RN, VSS 

## 2014-08-20 NOTE — Patient Instructions (Signed)
FOLLOW DISCHARGE INSTRUCTIONS (BLUE AND GREEN SHEETS).YOU HAD AN ENDOSCOPIC PROCEDURE TODAY AT [facility]:   Refer to the procedure report that was given to you for any specific questions about what was found during the examination.  If the procedure report does not answer your questions, please call your gastroenterologist to clarify.  If you requested that your care partner not be given the details of your procedure findings, then the procedure report has been included in a sealed envelope for you to review at your convenience later.  YOU SHOULD EXPECT: Some feelings of bloating in the abdomen. Passage of more gas than usual.  Walking can help get rid of the air that was put into your GI tract during the procedure and reduce the bloating. If you had a lower endoscopy (such as a colonoscopy or flexible sigmoidoscopy) you may notice spotting of blood in your stool or on the toilet paper. If you underwent a bowel prep for your procedure, you may not have a normal bowel movement for a few days.  Please Note:  You might notice some irritation and congestion in your nose or some drainage.  This is from the oxygen used during your procedure.  There is no need for concern and it should clear up in a day or so.  SYMPTOMS TO REPORT IMMEDIATELY:   Following lower endoscopy (colonoscopy or flexible sigmoidoscopy):  Excessive amounts of blood in the stool  Significant tenderness or worsening of abdominal pains  Swelling of the abdomen that is new, acute  Fever of 100F or higher  For urgent or emergent issues, a gastroenterologist can be reached at any hour by calling [phone].   DIET: Your first meal following the procedure should be a small meal and then it is ok to progress to your normal diet. Heavy or fried foods are harder to digest and may make you feel nauseous or bloated.  Likewise, meals heavy in dairy and vegetables can increase bloating.  Drink plenty of fluids but you  should avoid alcoholic beverages for 24 hours.  ACTIVITY:  You should plan to take it easy for the rest of today and you should NOT DRIVE or use heavy machinery until tomorrow (because of the sedation medicines used during the test).    FOLLOW UP: Our staff will call the number listed on your records the next business day following your procedure to check on you and address any questions or concerns that you may have regarding the information given to you following your procedure. If we do not reach you, we will leave a message.  However, if you are feeling well and you are not experiencing any problems, there is no need to return our call.  We will assume that you have returned to your regular daily activities without incident.  If any biopsies were taken you will be contacted by phone or by letter within the next 1-3 weeks.  Please call us at [phone] if you have not heard about the biopsies in 3 weeks.    SIGNATURES/CONFIDENTIALITY: You and/or your care partner have signed paperwork which will be entered into your electronic medical record.  These signatures attest to the fact that that the information above on your After Visit Summary has been reviewed and is understood.  Full responsibility of the confidentiality of this discharge information lies with you and/or your care-partner.  Polyp and diverticulosis information given.

## 2014-08-20 NOTE — Op Note (Signed)
[facility]  [address] [place], [address]   COLONOSCOPY PROCEDURE REPORT  PATIENT: Betty Fox, Betty Fox  MR#: [id number] BIRTHDATE: 1945/11/13 , 69  yrs. old GENDER: female ENDOSCOPIST: Inda Castle, MD REFERRED BL:TJQZ Sabra Heck, M.D. PROCEDURE DATE:  08/20/2014 PROCEDURE:   Colonoscopy, screening, Colonoscopy with snare polypectomy, and Colonoscopy with control of bleeding First Screening Colonoscopy - Avg.  risk and is 50 yrs.  old or older - No.  Prior Negative Screening - Now for repeat screening. 10 or more years since last screening  History of Adenoma - Now for follow-up colonoscopy & has been > or = to 3 yrs.  N/A  Polyps removed today? Yes ASA CLASS:   Class II INDICATIONS:Colorectal Neoplasm Risk Assessment for this procedure is average risk. MEDICATIONS: Monitored anesthesia care and Propofol 260 mg IV  DESCRIPTION OF PROCEDURE:   After the risks benefits and alternatives of the procedure were thoroughly explained, informed consent was obtained.  The digital rectal exam revealed no abnormalities of the rectum.   The [id number]  endoscope was introduced through the anus and advanced to the cecum, which was identified by both the appendix and ileocecal valve. No adverse events experienced.   The quality of the prep was (Suprep was used) excellent.  The instrument was then slowly withdrawn as the colon was fully examined. Estimated blood loss is zero unless otherwise noted in this procedure report.      COLON FINDINGS: A semi-pedunculated polyp measuring 12 mm in size was found in the descending colon.  A polypectomy was performed with a cold snare.  The resection was complete, the polyp tissue was completely retrieved and sent to histology.  Thermal therapy was used to control bleeding caused by polypectomy.  There was moderate but persistent arterial bleeding from the polypectomy site.  electrocautery was applied but did not stop the  bleeding.. Bleeding at the site was controlled using 2 hemoclips.  Two (2) placements were made.   There was mild diverticulosis noted in the sigmoid colon.  Retroflexed views revealed no abnormalities. The time to cecum = 3.1 Withdrawal time = 16.1   The scope was withdrawn and the procedure completed. COMPLICATIONS: There were no immediate complications.  ENDOSCOPIC IMPRESSION: 1.   Semi-pedunculated polyp was found in the descending colon; polypectomy was performed with a cold snare; bleeding at the site was controlled using 2 hemoclips 2.   Mild diverticulosis was noted in the sigmoid colon  RECOMMENDATIONS: If the polyp(s) removed today are proven to be adenomatous (pre-cancerous) polyps, you will need a colonoscopy in 3 years. Otherwise you should continue to follow colorectal cancer screening guidelines for "routine risk" patients with a colonoscopy in 10 years.  You will receive a letter within 1-2 weeks with the results of your biopsy as well as final recommendations.  Please call my office if you have not received a letter after 3 weeks. avoid NSAIDs for 2 weeks  eSigned:  Inda Castle, MD 08/20/2014 11:30 AM   cc:   PATIENT NAME:  Kendria, Halberg MR#: [id number]

## 2014-08-23 ENCOUNTER — Telehealth: Payer: Self-pay

## 2014-08-23 NOTE — Telephone Encounter (Signed)
  Follow up Call-  Call back number 08/20/2014 02/11/2013  Post procedure Call Back phone  # [phone]  Permission to leave phone message Yes Yes     Patient questions:  Do you have a fever, pain , or abdominal swelling? No. Pain Score  0 *  Have you tolerated food without any problems? Yes.    Have you been able to return to your normal activities? Yes.    Do you have any questions about your discharge instructions: Diet   No. Medications  No. Follow up visit  No.  Do you have questions or concerns about your Care? No.  Actions: * If pain score is 4 or above: No action needed, pain <4.

## 2014-08-25 ENCOUNTER — Encounter: Payer: Self-pay | Admitting: Gastroenterology

## 2014-10-10 ENCOUNTER — Other Ambulatory Visit: Payer: Self-pay | Admitting: Cardiology

## 2014-10-11 NOTE — Telephone Encounter (Signed)
Darlin Coco, MD at 05/31/2014 9:42 AM  amLODipine (NORVASC) 10 MG tabletTAKE 1 TABLET BY MOUTH ONCE DAILY  Plan: Currently she is doing well. Continue same medication. Continue weight loss and regular exercise. In one year for office visit and CBC. She has a new PCP, Dr. Sabra Heck.

## 2015-04-14 ENCOUNTER — Other Ambulatory Visit: Payer: Self-pay | Admitting: Cardiology

## 2015-06-03 ENCOUNTER — Encounter: Payer: Self-pay | Admitting: Interventional Cardiology

## 2015-06-03 ENCOUNTER — Ambulatory Visit (INDEPENDENT_AMBULATORY_CARE_PROVIDER_SITE_OTHER): Payer: Medicare Other | Admitting: Interventional Cardiology

## 2015-06-03 VITALS — BP 120/80 | HR 56 | Ht 63.0 in | Wt 201.2 lb

## 2015-06-03 DIAGNOSIS — E7800 Pure hypercholesterolemia, unspecified: Secondary | ICD-10-CM

## 2015-06-03 DIAGNOSIS — E119 Type 2 diabetes mellitus without complications: Secondary | ICD-10-CM

## 2015-06-03 DIAGNOSIS — I119 Hypertensive heart disease without heart failure: Secondary | ICD-10-CM

## 2015-06-03 DIAGNOSIS — I471 Supraventricular tachycardia: Secondary | ICD-10-CM | POA: Diagnosis not present

## 2015-06-03 DIAGNOSIS — R42 Dizziness and giddiness: Secondary | ICD-10-CM

## 2015-06-03 DIAGNOSIS — G250 Essential tremor: Secondary | ICD-10-CM

## 2015-06-03 NOTE — Patient Instructions (Signed)
**Note De-identified Betty Fox Obfuscation** Medication Instructions:  Same-no changes  Labwork: None  Testing/Procedures: None  Follow-Up: Your physician wants you to follow-up in: 1 year. You will receive a reminder letter in the mail two months in advance. If you don't receive a letter, please call our office to schedule the follow-up appointment.      If you need a refill on your cardiac medications before your next appointment, please call your pharmacy.   

## 2015-06-03 NOTE — Progress Notes (Signed)
Patient ID: Betty Fox, female   DOB: [birth year], 70 y.o.   MRN: [id number]     Cardiology Office Note   Date:  06/03/2015   ID:  Betty Fox, DOB Apr 02, 1945, MRN [id number]  PCP:  Tawanna Solo, MD    No chief complaint on file. f/u hypertensive heart disease   Wt Readings from Last 3 Encounters:  06/03/15 201 lb 3.2 oz (91.264 kg)  08/20/14 191 lb (86.637 kg)  08/06/14 191 lb 12.8 oz (87 kg)       History of Present Illness: Betty Fox is a 70 y.o. female  has a history of essential hypertension, hypothyroidism, dyslipidemia,and asthma. About 10 years ago she had pericarditis. She had not had any recurrent symptoms of pericarditis. She has a history of allergy to ACE inhibitors which cause a cough. He's had a past history of supraventricular tachycardia which she can abort by doing a Valsalva maneuver. He has a history of hypothyroidism followed by endocrinology. She has been diagnosed as Hashimoto's thyroiditis. Dr. Buddy Duty is her endocrinologist. She has a history of mild diabetes. She does not have any history of ischemic heart disease.  Since last visit she has been exercising more. Two years ago, She lost 30 pounds. She has been using any new step machine and also walking laps at the gym. She is a borderline diabetic and her PCP has recently started her on metformin. She is also back on Lipitor every other day uncertain dose. She has not been experiencing any chest pain. She has not been having any palpitations. Her dyspnea has improved since she lost weight.  She gained weight since her last visit.  She thinks some of this was from a recent trip to [place], [place].  She was diagnosed with Sjogrens syndrome.  Breathing is still better.  SHe exercises regularly.  Her thyroid has been regulated; dose was recently reduced.  No chest pain or DOE.  She has been active politically and with the church.  SHe was working at [employer] a few days ago and had a  shortlived chest pain and left leg pain.  It resolved spontaneously.    Her DM control is good, A1C is 6. BP at home is controlled.       Past Medical History  Diagnosis Date  . Hypertension   . Hyperlipidemia   . Asthma   . Hypothyroidism   . Pericarditis   . PSVT (paroxysmal supraventricular tachycardia) ([place])   . Uveitis   . Dyspepsia   . Hashimoto's thyroiditis   . Osteopenia   . Nephrolithiasis   . Vitamin D deficiency   . Arthritis   . Diverticulosis   . Pneumonia   . Allergic rhinitis   . Eczema   . Shingles     Past Surgical History  Procedure Laterality Date  . Bladder repair    . US echocardiography  07/27/2009    EF 55-60%  . Laparoscopic endometriosis fulguration    . Tubal ligation    . Breast reduction surgery Bilateral   . Laparotomy with oopherectomy Left 1979  . Mouth surgery  06/2014     Current Outpatient Prescriptions  Medication Sig Dispense Refill  . ALBUTEROL IN Inhale 1 puff into the lungs daily as needed (for wheezing or shortness of breath). As needed    . amLODipine (NORVASC) 10 MG tablet Take 1 tablet (10 mg total) by mouth daily. 90 tablet 2  . ARNUITY ELLIPTA 100 MCG/ACT AEPB USE 1  INHALATION ONCE DAILY DURING SEASONS OF DIFFICULTY  3  . atenolol (TENORMIN) 25 MG tablet Take 1 tablet (25 mg total) by mouth daily. 90 tablet 3  . atorvastatin (LIPITOR) 10 MG tablet Take 10 mg by mouth daily.    . Beclomethasone Dipropionate (QVAR IN) Inhale 2 puffs into the lungs daily.     Marland Kitchen BOOSTRIX 5-2.5-18.5 injection Inject 0.5 mLs into the muscle once.     . Cholecalciferol (VITAMIN D) 2000 UNITS tablet Take 2,000 Units by mouth daily.    Marland Kitchen desoximetasone (TOPICORT) 0.25 % cream Apply 1 application topically as needed (for exzema).     . EPINEPHrine 0.3 mg/0.3 mL IJ SOAJ injection Inject 0.3 mg into the muscle once.    . hydrochlorothiazide (HYDRODIURIL) 25 MG tablet Take 25 mg by mouth every other day. Or as directed    . KLOR-CON 10 10 MEQ  tablet TAKE 1 TABLET (10 MEQ TOTAL) BY MOUTH 2 (TWO) TIMES DAILY. 180 tablet 0  . metFORMIN (GLUCOPHAGE) 500 MG tablet Take 500 mg by mouth every other day.     Marland Kitchen SYNTHROID 75 MCG tablet TAKE 1 TABLET BY MOUTH EVERY MORNING ON EMPTY STOMACH  5  . valACYclovir (VALTREX) 1000 MG tablet Take 1,000 mg by mouth as needed (for shingles).      No current facility-administered medications for this visit.    Allergies:   Ampicillin; Ace inhibitors; Calcium carbonate antacid; and Prinivil    Social History:  The patient  reports that she has never smoked. She has never used smokeless tobacco. She reports that she drinks alcohol. She reports that she does not use illicit drugs.   Family History:  The patient's family history is negative for Colon cancer. She was adopted.    ROS:  Please see the history of present illness.   Otherwise, review of systems are positive for dry eyes, nose dry, throat dry, ears dry related to Sjogrens; stiffer joints, occasional dizziness.   All other systems are reviewed and negative.    PHYSICAL EXAM: VS:  BP 120/80 mmHg  Pulse 56  Ht 5\' 3"  (1.6 m)  Wt 201 lb 3.2 oz (91.264 kg)  BMI 35.65 kg/m2 , BMI Body mass index is 35.65 kg/(m^2). GEN: Well nourished, well developed, in no acute distress HEENT: normal Neck: no JVD, carotid bruits, or masses Cardiac: RRR; no murmurs, rubs, or gallops,no edema  Respiratory:  clear to auscultation bilaterally, normal work of breathing GI: soft, nontender, nondistended, + BS MS: no deformity or atrophy Skin: warm and dry,  Rash on right arm, macular Neuro:  Strength and sensation are intact Psych: euthymic mood, full affect   EKG:   The ekg ordered today demonstrates sinus bradycardia, no ST segment change   Recent Labs: No results found for requested labs within last 365 days.   Lipid Panel No results found for: CHOL, TRIG, HDL, CHOLHDL, VLDL, LDLCALC, LDLDIRECT   Other studies Reviewed: Additional studies/ records  that were reviewed today with results demonstrating: .   ASSESSMENT AND PLAN:  1. Hypertensive heart disease :  BP controlled.  Continue current meds.  HR slow, but she wants to stay on atenolol since it helps with tremor.  If dizziness gets worse, would reconsider. 2. Diabetes: Followed by Dr. Buddy Duty.  Continue 3. SVT:  Minimal palpitations. Controlled with atenolol. 4. Hyperlipidemia: Continue atorvastatin.  Foll0wed with Dr. Sabra Heck. 5.    Current medicines are reviewed at length with the patient today.  The patient concerns regarding  her medicines were addressed.  The following changes have been made:  No change  Labs/ tests ordered today include:   Orders Placed This Encounter  Procedures  . EKG 12-Lead    Recommend 150 minutes/week of aerobic exercise Low fat, low carb, high fiber diet recommended  Disposition:   FU in 1 year   Signed, Larae Grooms, MD  06/03/2015 9:41 AM    [facility] [place], [place], [place]  [address] Phone: [phone]; Fax: [phone]

## 2015-06-16 ENCOUNTER — Other Ambulatory Visit: Payer: Self-pay | Admitting: *Deleted

## 2015-06-16 ENCOUNTER — Other Ambulatory Visit: Payer: Self-pay

## 2015-06-16 MED ORDER — ATENOLOL 25 MG PO TABS: 25.0000 mg | ORAL_TABLET | Freq: Every day | ORAL | Status: DC

## 2015-06-16 NOTE — Telephone Encounter (Signed)
It is ok to give refill as the pt saw Dr Irish Lack on 5/19. Thanks.

## 2015-07-11 ENCOUNTER — Other Ambulatory Visit: Payer: Self-pay | Admitting: *Deleted

## 2015-07-11 MED ORDER — AMLODIPINE BESYLATE 10 MG PO TABS: 10.0000 mg | ORAL_TABLET | Freq: Every day | ORAL | Status: DC

## 2015-07-15 ENCOUNTER — Other Ambulatory Visit: Payer: Self-pay

## 2015-07-15 MED ORDER — POTASSIUM CHLORIDE ER 10 MEQ PO TBCR: 10.0000 meq | EXTENDED_RELEASE_TABLET | Freq: Two times a day (BID) | ORAL | Status: DC

## 2015-07-28 ENCOUNTER — Other Ambulatory Visit: Payer: Self-pay

## 2015-07-28 MED ORDER — HYDROCHLOROTHIAZIDE 25 MG PO TABS: 25.0000 mg | ORAL_TABLET | ORAL | Status: DC

## 2016-02-27 ENCOUNTER — Ambulatory Visit: Payer: Self-pay | Admitting: Dietician

## 2016-02-27 ENCOUNTER — Encounter: Payer: Medicare Other | Attending: Family Medicine | Admitting: Registered"

## 2016-02-27 DIAGNOSIS — R7303 Prediabetes: Secondary | ICD-10-CM | POA: Insufficient documentation

## 2016-02-27 DIAGNOSIS — Z713 Dietary counseling and surveillance: Secondary | ICD-10-CM | POA: Diagnosis not present

## 2016-02-27 DIAGNOSIS — R739 Hyperglycemia, unspecified: Secondary | ICD-10-CM

## 2016-02-27 NOTE — Progress Notes (Signed)
Medical Nutrition Therapy:  Appt start time: [id number] end time:  [id number].   Assessment:  Primary concerns today: pre-diabetes. Has a lot of allergic reactions to many foods and medications with experiencing hives frequently. Feels sick when eating something sweet, doughnuts or pecan pie. Patient reports that the last A1c was 6.0 with 6.5 being the highest. Patient reports the problem to eating regular healthy meals is scheduling, she knows how to eat healthy, but due to a lot of meetings in the evening getting a healthy dinner is difficult.  Preferred Learning Style:   No preference indicated   Learning Readiness:   Ready   MEDICATIONS: reviewed   DIETARY INTAKE:  Usual eating pattern includes 1-2 meals and 2 snacks per day.  Likes fruit and melons ice cream is "weakness". Avoided foods include red pepper, ginger due to allergic reaction.  24-hr recall:  B ( AM):  Grits and eggs OR Bacon OR chipped beef on toast OR liver pudding and toast OR cereal (likes raisin bran) Oatmeal with honey. OJ small glass, V8 low sodium  Snk ( AM): cheese and crackers, cottage cheese and bottled peaches OR small bagel with cream cheese L ( PM): none OR 1/2 sandwich tuna or chicken salad OR occasionally hot dog Snk ( PM): none D ( PM): meat, vegetable (smaller portions), starch (double portions) Snk ( Pm): peanut butter and crackers Beverages: water, seltzer water (recently replaced sodas)  Usual physical activity: weight training 3x week  Estimated energy needs: 1600 calories 180 g carbohydrates 100 g protein 53 g fat  Progress Towards Goal(s):  In progress.   Nutritional Diagnosis:  NB-1.1 Food and nutrition-related knowledge deficit As related to appropriate amounts of carbohydrates and meal skipping.  As evidenced by diet recall.    Intervention:  Nutrition Education. Reviewed food groups, portion sizes, and importance of not skipping meals. Discussed types of fats and including more unsaturated  fats, limiting saturated fats, and avoiding trans fats. Discussed portable snack options for evenings when she has meetings that interfere with dinner time.  Plan: Look into cranberry pills in place of cranberry juice Saltines and other baked goods - avoid trans fat Avoid skipping meals Include more vegetables with your meals Choose whole fruit over juice when possible Limit carbs to 2-3 choices (30-45 grams) per meal Include a lean protein with carbs  Teaching Method Utilized:  Visual Auditory  Handouts given during visit include:  My Plate  Snack sheet  Carb / meal planning card  Barriers to learning/adherence to lifestyle change: none  Demonstrated degree of understanding via:  Teach Back   Monitoring/Evaluation:  Dietary intake, exercise, and body weight in 6 week(s).

## 2016-02-27 NOTE — Patient Instructions (Addendum)
Plan: Look into cranberry pills in place of cranberry juice Saltines and other baked goods - avoid trans fat Avoid skipping meals Include more vegetables with your meals Choose whole fruit over juice when possible Limit carbs to 2-3 choices (30-45 grams) per meal Include a lean protein with carbs

## 2016-04-09 ENCOUNTER — Encounter: Payer: Medicare Other | Attending: Family Medicine | Admitting: Registered"

## 2016-04-09 DIAGNOSIS — Z713 Dietary counseling and surveillance: Secondary | ICD-10-CM | POA: Diagnosis not present

## 2016-04-09 DIAGNOSIS — R7303 Prediabetes: Secondary | ICD-10-CM | POA: Diagnosis not present

## 2016-04-09 NOTE — Progress Notes (Signed)
Medical Nutrition Therapy:  Appt start time: 2542 end time:  7062.   Assessment:  Primary concerns today: Follow-up for pre-diabetes. Has a lot of allergic reactions to many foods and medications with experiencing hives frequently. Patient reported she had 5 hours restful sleep.  Patient has been on prednisone for infection and swelling in face along with sinus issues. Patient reports prednisone made her very hungry and she ate a lot. Was also on antibiotic.  Patient reports that she is eating more vegetables, soup, using italian dressing to eat more broccoli.  Preferred Learning Style:   No preference indicated   Learning Readiness:   Ready  MEDICATIONS: reviewed   DIETARY INTAKE:  Usual eating pattern includes 1-2 meals and 2 snacks per day.  Likes fruit and melons ice cream is "weakness". Avoided foods include red pepper, ginger due to allergic reaction.   24-hr recall:  B ( AM): raisin bran OR oatmeal, with raisins Snk ( AM): none L ( PM): none if breakfast is late OR salad, chicken salad Snk ( PM): none D ( PM): drumlettes with onions in the crockpot, pinto beans, broccoli  Snk ( Pm): triscuit and cheese OR PB crackers Beverages: water, seltzer water, V-8 juice  Usual physical activity: weight training 3x week, stopped with dizzy symptoms  Estimated energy needs: 1600 calories 180 g carbohydrates 100 g protein 53 g fat  Progress Towards Goal(s):  In progress.   Nutritional Diagnosis:  NB-1.1 Food and nutrition-related knowledge deficit As related to appropriate amounts of carbohydrates and meal skipping.  As evidenced by diet recall.    Intervention:  Nutrition Education.   Plan: Continue to get physical activity, do arm chair activities if concerned about balance due to ear infection. Continue to have protein and carb balance meals and snacks Consider adding nuts to oatmeal  Teaching Method Utilized:  Visual Auditory  Handouts given during visit  include:  none  Barriers to learning/adherence to lifestyle change: none  Demonstrated degree of understanding via:  Teach Back   Monitoring/Evaluation:  Dietary intake, exercise, and body weight prn

## 2016-04-13 ENCOUNTER — Other Ambulatory Visit: Payer: Self-pay | Admitting: Interventional Cardiology

## 2016-04-23 ENCOUNTER — Encounter: Payer: Self-pay | Admitting: Interventional Cardiology

## 2016-05-22 ENCOUNTER — Encounter: Payer: Self-pay | Admitting: Interventional Cardiology

## 2016-06-01 ENCOUNTER — Other Ambulatory Visit: Payer: Self-pay | Admitting: Interventional Cardiology

## 2016-06-06 NOTE — Progress Notes (Signed)
Patient ID: Betty Fox, female   DOB: Nov 27, 1945, 71 y.o.   MRN: [id number]     Cardiology Office Note   Date:  06/07/2016   ID:  Betty Fox, DOB [birth year], MRN [id number]  PCP:  Kathyrn Lass, MD    No chief complaint on file. f/u hypertensive heart disease   Wt Readings from Last 3 Encounters:  06/07/16 208 lb (94.3 kg)  02/27/16 205 lb (93 kg)  06/03/15 201 lb 3.2 oz (91.3 kg)       History of Present Illness: Betty Fox is a 71 y.o. female  has a history of essential hypertension, hypothyroidism, dyslipidemia,and asthma. About 10 years ago she had pericarditis. She had not had any recurrent symptoms of pericarditis. She has a history of allergy to ACE inhibitors which cause a cough. He's had a past history of supraventricular tachycardia which she can abort by doing a Valsalva maneuver. He has a history of hypothyroidism followed by endocrinology. She has been diagnosed as Hashimoto's thyroiditis. Dr. Buddy Duty is her endocrinologist. She has a history of mild diabetes. She does not have any history of ischemic heart disease.  In 2015, She lost 30 pounds.  She was diagnosed with Sjogrens syndrome.   She has gained some weight back.  SHe is getting back to walking.  Limited by back and hip pain.    Denies : Chest pain. Dizziness. Leg edema. Nitroglycerin use. Orthopnea. Paroxysmal nocturnal dyspnea. Syncope.   She does report some DOE with walking.  Improving as she does more.    BP has been controlled.  SHe has been eating out more.  She thinks she has consumed more sodium.    Rare palpitations relieved with coughing.      Past Medical History:  Diagnosis Date  . Allergic rhinitis   . Arthritis   . Asthma   . Diverticulosis   . Dyspepsia   . Eczema   . Hashimoto's thyroiditis   . Hyperlipidemia   . Hypertension   . Hypothyroidism   . Nephrolithiasis   . Osteopenia   . Pericarditis   . Pneumonia   . PSVT (paroxysmal supraventricular tachycardia) ([place])    . Shingles   . Uveitis   . Vitamin D deficiency     Past Surgical History:  Procedure Laterality Date  . BLADDER REPAIR    . BREAST REDUCTION SURGERY Bilateral   . LAPAROSCOPIC ENDOMETRIOSIS FULGURATION    . laparotomy with oopherectomy Left 1979  . MOUTH SURGERY  06/2014  . TUBAL LIGATION    . US ECHOCARDIOGRAPHY  07/27/2009   EF 55-60%     Current Outpatient Prescriptions  Medication Sig Dispense Refill  . ALBUTEROL IN Inhale 1 puff into the lungs daily as needed (for wheezing or shortness of breath). As needed    . amLODipine (NORVASC) 10 MG tablet TAKE 1 TABLET BY MOUTH EVERY DAY 90 tablet 0  . ARNUITY ELLIPTA 100 MCG/ACT AEPB USE 1 INHALATION ONCE DAILY DURING SEASONS OF DIFFICULTY  3  . atenolol (TENORMIN) 25 MG tablet TAKE 1 TABLET (25 MG TOTAL) BY MOUTH DAILY. 90 tablet 0  . atorvastatin (LIPITOR) 10 MG tablet Take 10 mg by mouth daily.    . Beclomethasone Dipropionate (QVAR IN) Inhale 2 puffs into the lungs daily.     Marland Kitchen BOOSTRIX 5-2.5-18.5 injection Inject 0.5 mLs into the muscle once.     . Cholecalciferol (VITAMIN D) 2000 UNITS tablet Take 2,000 Units by mouth daily.    Marland Kitchen desoximetasone (  TOPICORT) 0.25 % cream Apply 1 application topically as needed (for exzema).     . EPINEPHrine 0.3 mg/0.3 mL IJ SOAJ injection Inject 0.3 mg into the muscle once.    . fluticasone (FLONASE) 50 MCG/ACT nasal spray Place 2 sprays into both nostrils daily.  5  . hydrochlorothiazide (HYDRODIURIL) 25 MG tablet Take 1 tablet (25 mg total) by mouth every other day. Or as directed 30 tablet 6  . potassium chloride (KLOR-CON 10) 10 MEQ tablet Take 1 tablet (10 mEq total) by mouth 2 (two) times daily. 180 tablet 3  . SYNTHROID 75 MCG tablet TAKE 1 TABLET BY MOUTH EVERY MORNING ON EMPTY STOMACH  5  . valACYclovir (VALTREX) 1000 MG tablet Take 1,000 mg by mouth as needed (for shingles).      No current facility-administered medications for this visit.     Allergies:   Ampicillin; Ace  inhibitors; Calcium carbonate antacid; Prinivil [lisinopril]; and Sulfur    Social History:  The patient  reports that she has never smoked. She has never used smokeless tobacco. She reports that she drinks alcohol. She reports that she does not use drugs.   Family History:  The patient's family history is not on file. She was adopted.    ROS:  Please see the history of present illness.   Otherwise, review of systems are positive for occasional palpitations.   All other systems are reviewed and negative.    PHYSICAL EXAM: VS:  BP 130/78 (BP Location: Right Arm, Patient Position: Sitting, Cuff Size: Large)   Pulse (!) 50   Ht 5\' 4"  (1.626 m)   Wt 208 lb (94.3 kg)   SpO2 97%   BMI 35.70 kg/m  , BMI Body mass index is 35.7 kg/m. GEN: Well nourished, well developed, in no acute distress  HEENT: normal  Neck: no JVD, carotid bruits, or masses Cardiac: RRR; no murmurs, rubs, or gallops,no edema  Respiratory:  clear to auscultation bilaterally, normal work of breathing GI: soft, nontender, nondistended, obese MS: no deformity or atrophy  Skin: warm and dry, ; left shin eczema rash Neuro:  Strength and sensation are intact Psych: euthymic mood, full affect    EKG:   The ekg ordered today demonstrates SB, nonspecific ST flattening   Recent Labs: No results found for requested labs within last 8760 hours.   Lipid Panel No results found for: CHOL, TRIG, HDL, CHOLHDL, VLDL, LDLCALC, LDLDIRECT   Other studies Reviewed: Additional studies/ records that were reviewed today with results demonstrating: 2011 echo: Normal LV function with LVH.  Impaired relaxation.   ASSESSMENT AND PLAN:  1. Hypertensive heart disease :  Blood pressure controlled. Continue current medicines. She appears euvolemic. Atenolol also help with her tremor. 2. Diabetes: COntinue healthy eating and exercise,.  Weight loss will also help her blood sugars. She is increasing her exercise as well which should also  help.  3. SVT:  Episodes of SVT relieved with coughing.  Continue atenolol.  If sx gt worse, would consider referral to EP.  4. Hyperlipidemia: Lipids checked with PMD. COntinue atorvastatin.    Current medicines are reviewed at length with the patient today.  The patient concerns regarding her medicines were addressed.  The following changes have been made:  No change  Labs/ tests ordered today include:   No orders of the defined types were placed in this encounter.   Recommend 150 minutes/week of aerobic exercise Low fat, low carb, high fiber diet recommended  Disposition:   FU  in 1 year   Signed, Larae Grooms, MD  06/07/2016 11:58 AM    [facility] [place], [place], [place]  [address] Phone: [phone]; Fax: [phone]

## 2016-06-07 ENCOUNTER — Encounter: Payer: Self-pay | Admitting: Interventional Cardiology

## 2016-06-07 ENCOUNTER — Encounter (INDEPENDENT_AMBULATORY_CARE_PROVIDER_SITE_OTHER): Payer: Self-pay

## 2016-06-07 ENCOUNTER — Ambulatory Visit (INDEPENDENT_AMBULATORY_CARE_PROVIDER_SITE_OTHER): Payer: Medicare Other | Admitting: Interventional Cardiology

## 2016-06-07 VITALS — BP 130/78 | HR 50 | Ht 64.0 in | Wt 208.0 lb

## 2016-06-07 DIAGNOSIS — I119 Hypertensive heart disease without heart failure: Secondary | ICD-10-CM

## 2016-06-07 DIAGNOSIS — I471 Supraventricular tachycardia, unspecified: Secondary | ICD-10-CM

## 2016-06-07 DIAGNOSIS — E7800 Pure hypercholesterolemia, unspecified: Secondary | ICD-10-CM

## 2016-06-07 DIAGNOSIS — E119 Type 2 diabetes mellitus without complications: Secondary | ICD-10-CM | POA: Diagnosis not present

## 2016-06-07 NOTE — Patient Instructions (Signed)

## 2016-07-11 ENCOUNTER — Other Ambulatory Visit: Payer: Self-pay | Admitting: Interventional Cardiology

## 2016-07-16 ENCOUNTER — Other Ambulatory Visit: Payer: Self-pay | Admitting: Interventional Cardiology

## 2016-09-07 ENCOUNTER — Other Ambulatory Visit: Payer: Self-pay | Admitting: Interventional Cardiology

## 2016-09-15 ENCOUNTER — Other Ambulatory Visit: Payer: Self-pay | Admitting: Interventional Cardiology

## 2017-06-14 ENCOUNTER — Other Ambulatory Visit: Payer: Self-pay | Admitting: Interventional Cardiology

## 2017-06-19 ENCOUNTER — Encounter: Payer: Self-pay | Admitting: Gastroenterology

## 2017-07-10 ENCOUNTER — Other Ambulatory Visit: Payer: Self-pay | Admitting: Interventional Cardiology

## 2017-07-10 NOTE — Progress Notes (Signed)
Cardiology Office Note   Date:  07/11/2017   ID:  [name], DOB Sep 15, 1945, MRN [id number]  PCP:  Kathyrn Lass, MD    No chief complaint on file.  SVT  Wt Readings from Last 3 Encounters:  07/11/17 209 lb (94.8 kg)  06/07/16 208 lb (94.3 kg)  02/27/16 205 lb (93 kg)       History of Present Illness: Betty Fox is a 72 y.o. female   has a history of essential hypertension, hypothyroidism, dyslipidemia,and asthma. She was followed by Dr. Mare Ferrari in the past.  About 10 years ago she had pericarditis. She had not had any recurrent symptoms of pericarditis. She has a history of allergy to ACE inhibitors which cause a cough. He's had a past history of supraventricular tachycardia which she can abort by doing a Valsalva maneuver. He has a history of hypothyroidism followed by endocrinology. She has been diagnosed as Hashimoto's thyroiditis. Dr. Buddy Duty is her endocrinologist. She has a history of mild diabetes. She does not have any history of ischemic heart disease.  In 2015, She lost 30 pounds.  She was diagnosed with Sjogrens syndrome.   Since the last visit, she has had brief episodes of palpitaitons that resolve with coughing.   Denies : recent Chest pain. Dizziness. Leg edema. Nitroglycerin use. Orthopnea. Paroxysmal nocturnal dyspnea. Shortness of breath. Syncope.     Past Medical History:  Diagnosis Date  . Allergic rhinitis   . Arthritis   . Asthma   . Diverticulosis   . Dyspepsia   . Eczema   . Hashimoto's thyroiditis   . Hyperlipidemia   . Hypertension   . Hypothyroidism   . Nephrolithiasis   . Osteopenia   . Pericarditis   . Pneumonia   . PSVT (paroxysmal supraventricular tachycardia) ([place])   . Shingles   . Uveitis   . Vitamin D deficiency     Past Surgical History:  Procedure Laterality Date  . BLADDER REPAIR    . BREAST REDUCTION SURGERY Bilateral   . LAPAROSCOPIC ENDOMETRIOSIS FULGURATION    . laparotomy with oopherectomy Left 1979  .  MOUTH SURGERY  06/2014  . TUBAL LIGATION    . US ECHOCARDIOGRAPHY  07/27/2009   EF 55-60%     Current Outpatient Medications  Medication Sig Dispense Refill  . ALBUTEROL IN Inhale 1 puff into the lungs daily as needed (for wheezing or shortness of breath). As needed    . amLODipine (NORVASC) 10 MG tablet TAKE 1 TABLET BY MOUTH EVERY DAY 90 tablet 3  . ARNUITY ELLIPTA 100 MCG/ACT AEPB USE 1 INHALATION ONCE DAILY DURING SEASONS OF DIFFICULTY  3  . atenolol (TENORMIN) 25 MG tablet TAKE 1 TABLET (25 MG TOTAL) BY MOUTH DAILY. 90 tablet 0  . atorvastatin (LIPITOR) 10 MG tablet Take 10 mg by mouth daily.    . Beclomethasone Dipropionate (QVAR IN) Inhale 2 puffs into the lungs daily.     Marland Kitchen BOOSTRIX 5-2.5-18.5 injection Inject 0.5 mLs into the muscle once.     . Cholecalciferol (VITAMIN D) 2000 UNITS tablet Take 2,000 Units by mouth daily.    Marland Kitchen desoximetasone (TOPICORT) 0.25 % cream Apply 1 application topically as needed (for exzema).     . EPINEPHrine 0.3 mg/0.3 mL IJ SOAJ injection Inject 0.3 mg into the muscle once.    . fluticasone (FLONASE) 50 MCG/ACT nasal spray Place 2 sprays into both nostrils daily.  5  . hydrochlorothiazide (HYDRODIURIL) 25 MG tablet TAKE 1 TABLET  BY MOUTH EVERY OTHER DAY OR AS DIRECTED 30 tablet 7  . KLOR-CON 10 10 MEQ tablet TAKE 1 TABLET (10 MEQ TOTAL) BY MOUTH 2 (TWO) TIMES DAILY. 180 tablet 0  . SYNTHROID 88 MCG tablet Take 88 mcg by mouth daily.  5  . valACYclovir (VALTREX) 1000 MG tablet Take 1,000 mg by mouth as needed (for shingles).      No current facility-administered medications for this visit.     Allergies:   Ampicillin; Ace inhibitors; Calcium carbonate antacid; Prinivil [lisinopril]; Esomeprazole magnesium; Omeprazole magnesium; Penicillins; Ranitidine hcl; Sulfa antibiotics; Sulfur; and Zoledronic acid    Social History:  The patient  reports that she has never smoked. She has never used smokeless tobacco. She reports that she drinks alcohol. She  reports that she does not use drugs.   Family History:  The patient's family history is not on file. She was adopted.    ROS:  Please see the history of present illness.   Otherwise, review of systems are positive for rash.   All other systems are reviewed and negative.    PHYSICAL EXAM: VS:  BP 130/82   Pulse (!) 55   Ht 5\' 4"  (1.626 m)   Wt 209 lb (94.8 kg)   SpO2 97%   BMI 35.87 kg/m  , BMI Body mass index is 35.87 kg/m. GEN: Well nourished, well developed, in no acute distress  HEENT: normal  Neck: no JVD, carotid bruits, or masses Cardiac: RRR; no murmurs, rubs, or gallops,no edema  Respiratory:  clear to auscultation bilaterally, normal work of breathing GI: soft, nontender, nondistended, + BS MS: no deformity or atrophy  Skin: warm and dry, ; rash on arms Neuro:  Strength and sensation are intact Psych: euthymic mood, full affect   EKG:   The ekg ordered today demonstrates sinus bradycardia, nonspecific T wave flattening   Recent Labs: No results found for requested labs within last 8760 hours.   Lipid Panel No results found for: CHOL, TRIG, HDL, CHOLHDL, VLDL, LDLCALC, LDLDIRECT   Other studies Reviewed: Additional studies/ records that were reviewed today with results demonstrating: labs reviewed.  Normal LV function in 2011 by echo.    ASSESSMENT AND PLAN:  1. SVT: Sx are controlled.  Continue atenolol. No syncope.  Continue valsalva to help with stopping arrhtyhmia when needed.  2. Diabetes: A1C increased to 7.0.  She stopped sodas and concentrated sweets. She states that her A1C dropped. LDL target < 100.  3. Hypertensive heart disease: BP controlled. The current medical regimen is effective;  continue present plan and medications. 4. Hyperlipidemia: LDL 113. Taking atorvastatin every other day.  Increase to 10 mg daily, 5x/week.  Not taking it daily due to leg cramps.     Current medicines are reviewed at length with the patient today.  The patient  concerns regarding her medicines were addressed.  The following changes have been made:  No change  Labs/ tests ordered today include:  No orders of the defined types were placed in this encounter.   Recommend 150 minutes/week of aerobic exercise Low fat, low carb, high fiber diet recommended  Disposition:   FU in 1 year   Signed, Larae Grooms, MD  07/11/2017 10:00 AM    [address], [place], [place]  [address] Phone: [phone]; Fax: [phone]

## 2017-07-11 ENCOUNTER — Encounter: Payer: Self-pay | Admitting: Interventional Cardiology

## 2017-07-11 ENCOUNTER — Ambulatory Visit: Payer: Medicare Other | Admitting: Interventional Cardiology

## 2017-07-11 ENCOUNTER — Encounter (INDEPENDENT_AMBULATORY_CARE_PROVIDER_SITE_OTHER): Payer: Self-pay

## 2017-07-11 VITALS — BP 130/82 | HR 55 | Ht 64.0 in | Wt 209.0 lb

## 2017-07-11 DIAGNOSIS — E119 Type 2 diabetes mellitus without complications: Secondary | ICD-10-CM | POA: Diagnosis not present

## 2017-07-11 DIAGNOSIS — E7800 Pure hypercholesterolemia, unspecified: Secondary | ICD-10-CM

## 2017-07-11 DIAGNOSIS — I471 Supraventricular tachycardia: Secondary | ICD-10-CM

## 2017-07-11 DIAGNOSIS — I119 Hypertensive heart disease without heart failure: Secondary | ICD-10-CM | POA: Diagnosis not present

## 2017-07-11 MED ORDER — ATORVASTATIN CALCIUM 10 MG PO TABS
ORAL_TABLET | ORAL | 11 refills | Status: DC
Start: 2017-07-11 — End: 2021-07-25

## 2017-07-11 NOTE — Patient Instructions (Signed)
Medication Instructions:  Your physician has recommended you make the following change in your medication:   INCREASE: atorvastatin 10 mg tablet: Take 1 tablet by mouth 5 days a week  Labwork: Your physician recommends that you return for a FASTING lipid profile and liver function panel in 3 months   Testing/Procedures: None ordered  Follow-Up: Your physician wants you to follow-up in: 1 year with Dr. Irish Lack. You will receive a reminder letter in the mail two months in advance. If you don't receive a letter, please call our office to schedule the follow-up appointment.   Any Other Special Instructions Will Be Listed Below (If Applicable).     If you need a refill on your cardiac medications before your next appointment, please call your pharmacy.

## 2017-07-13 ENCOUNTER — Other Ambulatory Visit: Payer: Self-pay | Admitting: Interventional Cardiology

## 2017-09-02 ENCOUNTER — Encounter: Payer: Self-pay | Admitting: Gastroenterology

## 2017-09-02 ENCOUNTER — Ambulatory Visit: Payer: Medicare Other | Admitting: Gastroenterology

## 2017-09-02 VITALS — BP 126/72 | HR 74 | Ht 64.0 in | Wt 212.8 lb

## 2017-09-02 DIAGNOSIS — R197 Diarrhea, unspecified: Secondary | ICD-10-CM | POA: Diagnosis not present

## 2017-09-02 DIAGNOSIS — Z86010 Personal history of colonic polyps: Secondary | ICD-10-CM | POA: Diagnosis not present

## 2017-09-02 DIAGNOSIS — K5909 Other constipation: Secondary | ICD-10-CM

## 2017-09-02 MED ORDER — NA SULFATE-K SULFATE-MG SULF 17.5-3.13-1.6 GM/177ML PO SOLN: 1.0000 | Freq: Once | ORAL | 0 refills | Status: AC

## 2017-09-02 NOTE — Progress Notes (Signed)
Betty Fox    [id number]    12/04/1945  Primary Care Physician:Miller, Lattie Haw, MD  Referring Physician: Kathyrn Lass, MD [place], [place] [address]  Chief complaint:  Diarrhea alternating with constipation  HPI: 72 year old female previously followed by Dr. Olevia Perches, last seen in January 2015 is here to reestablish care. She has history of irregular bowel habits with alternating constipation and diarrhea, she has worsening diarrhea when she consumes milk or dairy products.  On most days she has bowel movement every day or every other day.  Denies any dark stool or blood per rectum No loss of appetite or weight loss.   Colonoscopy August 20, 2014 by Dr. Deatra Ina with removal of 12 mm polyp from descending colon, persistent oozing at polypectomy site, hemostasis achieved with placement of clips at sigmoid diverticulosis and small internal hemorrhoids.  EGD February 11, 2013 showed irregular Z line and esophagitis.  Esophageal biopsies negative for Barrett's esophagus and gastric biopsies negative for intestinal metaplasia, dysplasia or H. pylori.  Other medical history significant for hypertension, Hashimoto's thyroiditis, Sjogren's disease and supraventricular tachycardia  Outpatient Encounter Medications as of 09/02/2017  Medication Sig  . ALBUTEROL IN Inhale 1 puff into the lungs daily as needed (for wheezing or shortness of breath). As needed  . amLODipine (NORVASC) 10 MG tablet TAKE 1 TABLET BY MOUTH EVERY DAY  . ARNUITY ELLIPTA 100 MCG/ACT AEPB USE 1 INHALATION ONCE DAILY DURING SEASONS OF DIFFICULTY  . atenolol (TENORMIN) 25 MG tablet TAKE 1 TABLET (25 MG TOTAL) BY MOUTH DAILY.  Marland Kitchen atorvastatin (LIPITOR) 10 MG tablet Take 1 tablet (10 mg total) by mouth 5 days a week  . Beclomethasone Dipropionate (QVAR IN) Inhale 2 puffs into the lungs daily.   Marland Kitchen BOOSTRIX 5-2.5-18.5 injection Inject 0.5 mLs into the muscle once.   . Cholecalciferol (VITAMIN D) 2000 UNITS  tablet Take 2,000 Units by mouth daily.  Marland Kitchen desoximetasone (TOPICORT) 0.25 % cream Apply 1 application topically as needed (for exzema).   . EPINEPHrine 0.3 mg/0.3 mL IJ SOAJ injection Inject 0.3 mg into the muscle once.  . fluticasone (FLONASE) 50 MCG/ACT nasal spray Place 2 sprays into both nostrils daily.  . hydrochlorothiazide (HYDRODIURIL) 25 MG tablet TAKE 1 TABLET BY MOUTH EVERY OTHER DAY OR AS DIRECTED  . KLOR-CON 10 10 MEQ tablet TAKE 1 TABLET (10 MEQ TOTAL) BY MOUTH 2 (TWO) TIMES DAILY.  . SYNTHROID 88 MCG tablet Take 88 mcg by mouth daily.  . valACYclovir (VALTREX) 1000 MG tablet Take 1,000 mg by mouth as needed (for shingles).    No facility-administered encounter medications on file as of 09/02/2017.     Allergies as of 09/02/2017 - Review Complete 07/11/2017  Allergen Reaction Noted  . Ampicillin Hives and Swelling 10/06/2010  . Ace inhibitors Cough 10/06/2010  . Calcium carbonate antacid Hives 10/06/2010  . Prinivil [lisinopril] Cough 10/06/2010  . Esomeprazole magnesium  07/25/2015  . Omeprazole magnesium  07/25/2015  . Penicillins  07/25/2015  . Ranitidine hcl  07/25/2015  . Sulfa antibiotics  07/25/2015  . Sulfur Itching 02/27/2016  . Zoledronic acid  07/25/2015    Past Medical History:  Diagnosis Date  . Allergic rhinitis   . Arthritis   . Asthma   . Diverticulosis   . Dyspepsia   . Eczema   . Hashimoto's thyroiditis   . Hyperlipidemia   . Hypertension   . Hypothyroidism   . Nephrolithiasis   . Osteopenia   .  Pericarditis   . Pneumonia   . PSVT (paroxysmal supraventricular tachycardia) ([place])   . Shingles   . Uveitis   . Vitamin D deficiency     Past Surgical History:  Procedure Laterality Date  . BLADDER REPAIR    . BREAST REDUCTION SURGERY Bilateral   . LAPAROSCOPIC ENDOMETRIOSIS FULGURATION    . laparotomy with oopherectomy Left 1979  . MOUTH SURGERY  06/2014  . TUBAL LIGATION    . US ECHOCARDIOGRAPHY  07/27/2009   EF 55-60%    Family  History  Adopted: Yes  Problem Relation Age of Onset  . Colon cancer Neg Hx     Social History   Socioeconomic History  . Marital status: Married    Spouse name: Not on file  . Number of children: 1 A  . Years of education: Not on file  . Highest education level: Not on file  Occupational History  . Occupation: retired Tour manager  . Financial resource strain: Not on file  . Food insecurity:    Worry: Not on file    Inability: Not on file  . Transportation needs:    Medical: Not on file    Non-medical: Not on file  Tobacco Use  . Smoking status: Never Smoker  . Smokeless tobacco: Never Used  Substance and Sexual Activity  . Alcohol use: Yes    Comment: 1 per year  . Drug use: No  . Sexual activity: Not on file  Lifestyle  . Physical activity:    Days per week: Not on file    Minutes per session: Not on file  . Stress: Not on file  Relationships  . Social connections:    Talks on phone: Not on file    Gets together: Not on file    Attends religious service: Not on file    Active member of club or organization: Not on file    Attends meetings of clubs or organizations: Not on file    Relationship status: Not on file  . Intimate partner violence:    Fear of current or ex partner: Not on file    Emotionally abused: Not on file    Physically abused: Not on file    Forced sexual activity: Not on file  Other Topics Concern  . Not on file  Social History Narrative  . Not on file      Review of systems: Review of Systems  Constitutional: Negative for fever and chills.  Positive for fatigue HENT: Positive for sinus trouble Eyes: Negative for blurred vision.  Respiratory: Negative for shortness of breath and wheezing.  Positive for cough Cardiovascular: Negative for chest pain and palpitations.  Gastrointestinal: as per HPI Genitourinary: Negative for dysuria.  Positive urgency and urinary incontinence  Musculoskeletal: Positive for myalgias, back  pain and joint pain.  Skin: Negative for itching and positive skin rash .  Neurological: Negative for dizziness, tremors, focal weakness, seizures and loss of consciousness.  Endo/Heme/Allergies: Positive for seasonal allergies.  Psychiatric/Behavioral: Negative for depression, suicidal ideas and hallucinations.  All other systems reviewed and are negative.   Physical Exam: Vitals:   09/02/17 1411  BP: 126/72  Pulse: 74   Body mass index is 36.53 kg/m. Gen:      No acute distress HEENT:  EOMI, sclera anicteric Neck:     No masses; no thyromegaly Lungs:    Clear to auscultation bilaterally; normal respiratory effort CV:         Regular rate and  rhythm; no murmurs Abd:      + bowel sounds; soft, non-tender; no palpable masses, no distension Ext:    No edema; adequate peripheral perfusion Skin:      Warm and dry; no rash Neuro: alert and oriented x 3 Psych: normal mood and affect  Data Reviewed:  Reviewed labs, radiology imaging, old records and pertinent past GI work up   Assessment and Plan/Recommendations:  72 year old female with history of Hashimoto's thyroiditis, Sjogren's syndrome, proximal supraventricular tachycardia, hypertension, obesity Irregular bowel habits with alternating constipation and diarrhea. Lactose-free diet Benefiber 1 teaspoon 3 times daily with meals Due for surveillance colonoscopy, history of large adenomatous polyp removed in 2016 The risks and benefits as well as alternatives of endoscopic procedure(s) have been discussed and reviewed. All questions answered. The patient agrees to proceed.    Damaris Hippo , MD [phone]    CC: Kathyrn Lass, MD

## 2017-09-02 NOTE — Patient Instructions (Addendum)
You have been scheduled for an endoscopy and colonoscopy. Please follow the written instructions given to you at your visit today. Please pick up your prep supplies at the pharmacy within the next 1-3 days. If you use inhalers (even only as needed), please bring them with you on the day of your procedure. Your physician has requested that you go to www.startemmi.com and enter the access code given to you at your visit today. This web site gives a general overview about your procedure. However, you should still follow specific instructions given to you by our office regarding your preparation for the procedure.  Lactose-Free Diet, Adult If you have lactose intolerance, you are not able to digest lactose. Lactose is a natural sugar found mainly in milk and milk products. You may need to avoid all foods and beverages that contain lactose. A lactose-free diet can help you do this. What do I need to know about this diet?  Do not consume foods, beverages, vitamins, minerals, or medicines with lactose. Read ingredients lists carefully.  Look for the words "lactose-free" on labels.  Use lactase enzyme drops or tablets as directed by your health care provider.  Use lactose-free milk or a milk alternative, such as soy milk, for drinking and cooking.  Make sure you get enough calcium and vitamin D in your diet. A lactose-free eating plan can be lacking in these important nutrients.  Take calcium and vitamin D supplements as directed by your health care provider. Talk to your provider about supplements if you are not able to get enough calcium and vitamin D from food. Which foods have lactose? Lactose is found in:  Milk and foods made from milk.  Yogurt.  Cheese.  Butter.  Margarine.  Sour cream.  Cream.  Whipped toppings and nondairy creamers.  Ice cream and other milk-based desserts.  Lactose is also found in foods or products made with milk or milk ingredients. To find out whether a  food contains milk or a milk ingredient, look at the ingredients list. Avoid foods with the statement "May contain milk" and foods that contain:  Butter.  Cream.  Milk.  Milk solids.  Milk powder.  Whey.  Curd.  Caseinate.  Lactose.  Lactalbumin.  Lactoglobulin.  What are some alternatives to milk and foods made with milk products?  Lactose-free milk.  Soy milk with added calcium and vitamin D.  Almond, coconut, or rice milk with added calcium and vitamin D. Note that these are low in protein.  Soy products, such as soy yogurt, soy cheese, soy ice cream, and soy-based sour cream. Which foods can I eat? Grains Breads and rolls made without milk, such as [place], [place], or [place] bread, bagels, pita, and [employer]. Corn tortillas, corn meal, grits, and polenta. Crackers without lactose or milk solids, such as soda crackers and graham crackers. Cooked or dry cereals without lactose or milk solids. Pasta, quinoa, couscous, barley, oats, bulgur, farro, rice, wild rice, or other grains prepared without milk or lactose. Plain popcorn. Vegetables Fresh, frozen, and canned vegetables without cheese, cream, or butter sauces. Fruits All fresh, canned, frozen, or dried fruits that are not processed with lactose. Meats and Other Protein Sources Plain beef, chicken, fish, [place], lamb, veal, pork, wild game, or ham. Kosher-prepared meat products. Strained or junior meats that do not contain milk. Eggs. Soy meat substitutes. Beans, lentils, and hummus. Tofu. Nuts and seeds. Peanut or other nut butters without lactose. Soups, casseroles, and mixed dishes without cheese, cream, or milk. Dairy  Lactose-free milk. Soy, rice, or almond milk with added calcium and vitamin D. Soy cheese and yogurt. Beverages Carbonated drinks. Tea. Coffee, freeze-dried coffee, and some instant coffees. Fruit and vegetable juices. Condiments Soy sauce. Carob powder. Olives. Gravy made with water. Baker's cocoa.  Angie Fava. Pure seasonings and spices. Ketchup. Mustard. Bouillon. Broth. Sweets and Desserts Water and fruit ices. Gelatin. Cookies, pies, or cakes made from allowed ingredients, such as angel food cake. Pudding made with water or a milk substitute. Lactose-free tofu desserts. Soy, coconut milk, or rice-milk-based frozen desserts. Sugar. Honey. Jam, jelly, and marmalade. Molasses. Pure sugar candy. Dark chocolate without milk. Marshmallows. Fats and Oils Margarines and salad dressings that do not contain milk. Berniece Salines. Vegetable oils. Shortening. Mayonnaise. Soy or coconut-based cream. The items listed above may not be a complete list of recommended foods or beverages. Contact your dietitian for more options. Which foods are not recommended? Grains Breads and rolls that contain milk. Toaster pastries. Muffins, biscuits, waffles, cornbread, and pancakes. These can be prepared at home, commercial, or from mixes. Sweet rolls, donuts, English muffins, fry bread, lefse, flour tortillas with lactose, or [place] toast made with milk or milk ingredients. Crackers that contain lactose. Corn curls. Cooked or dry cereals with lactose. Vegetables Creamed or breaded vegetables. Vegetables in a cheese or butter sauce or with lactose-containing margarines. Instant potatoes. [place] fries. Scalloped or au gratin potatoes. Fruits None. Meats and Other Protein Sources Scrambled eggs, omelets, and souffles that contain milk. Creamed or breaded meat, fish, chicken, or [place]. Sausage products, such as wieners and liver sausage. Cold cuts that contain milk solids. Cheese, cottage cheese, ricotta cheese, and cheese spreads. Lasagna and macaroni and cheese. Pizza. Peanut or other nut butters with added milk solids. Casseroles or mixed dishes containing milk or cheese. Dairy All dairy products, including milk, goat's milk, buttermilk, kefir, acidophilus milk, flavored milk, evaporated milk, condensed milk, dulce de Lago,  eggnog, yogurt, cheese, and cheese spreads. Beverages Hot chocolate. Cocoa with lactose. Instant iced teas. Powdered fruit drinks. Smoothies made with milk or yogurt. Condiments Chewing gum that has lactose. Cocoa that has lactose. Spice blends if they contain milk products. Artificial sweeteners that contain lactose. Nondairy creamers. Sweets and Desserts Ice cream, ice milk, gelato, sherbet, and frozen yogurt. Custard, pudding, and mousse. Cake, cream pies, cookies, and other desserts containing milk, cream, cream cheese, or milk chocolate. Pie crust made with milk-containing margarine or butter. Reduced-calorie desserts made with a sugar substitute that contains lactose. Toffee and butterscotch. Milk, white, or dark chocolate that contains milk. Fudge. Caramel. Fats and Oils Margarines and salad dressings that contain milk or cheese. Cream. Half and half. Cream cheese. Sour cream. Chip dips made with sour cream or yogurt. The items listed above may not be a complete list of foods and beverages to avoid. Contact your dietitian for more information. Am I getting enough calcium? Calcium is found in many foods that contain lactose and is important for bone health. The amount of calcium you need depends on your age:  Adults younger than 50 years: 1000 mg of calcium a day.  Adults older than 50 years: 1200 mg of calcium a day.  If you are not getting enough calcium, other calcium sources include:  Orange juice with calcium added. There are 300-350 mg of calcium in 1 cup of orange juice.  Sardines with edible bones. There are 325 mg of calcium in 3 oz of sardines.  Calcium-fortified soy milk. There are 300-400 mg of calcium in 1  cup of calcium-fortified soy milk.  Calcium-fortified rice or almond milk. There are 300 mg of calcium in 1 cup of calcium-fortified rice or almond milk.  Canned salmon with edible bones. There are 180 mg of calcium in 3 oz of canned salmon with edible  bones.  Calcium-fortified breakfast cereals. There are [phone] mg of calcium in calcium-fortified breakfast cereals.  Tofu set with calcium sulfate. There are 250 mg of calcium in  cup of tofu set with calcium sulfate.  Spinach, cooked. There are 145 mg of calcium in  cup of cooked spinach.  Edamame, cooked. There are 130 mg of calcium in  cup of cooked edamame.  Collard greens, cooked. There are 125 mg of calcium in  cup of cooked collard greens.  Kale, frozen or cooked. There are 90 mg of calcium in  cup of cooked or frozen kale.  Almonds. There are 95 mg of calcium in  cup of almonds.  Broccoli, cooked. There are 60 mg of calcium in 1 cup of cooked broccoli.  This information is not intended to replace advice given to you by your health care provider. Make sure you discuss any questions you have with your health care provider. Document Released: 06/23/2001 Document Revised: 06/09/2015 Document Reviewed: 04/03/2013 Elsevier Interactive Patient Education  2018 [employer].  Take Benefiber 1 teaspoon three times a day with meals

## 2017-09-05 ENCOUNTER — Encounter: Payer: Self-pay | Admitting: Gastroenterology

## 2017-09-18 ENCOUNTER — Other Ambulatory Visit: Payer: Self-pay | Admitting: Interventional Cardiology

## 2017-09-26 ENCOUNTER — Other Ambulatory Visit: Payer: Self-pay | Admitting: Interventional Cardiology

## 2017-09-26 MED ORDER — HYDROCHLOROTHIAZIDE 25 MG PO TABS: ORAL_TABLET | ORAL | 9 refills | Status: DC

## 2017-10-07 ENCOUNTER — Other Ambulatory Visit: Payer: Medicare Other

## 2017-10-09 ENCOUNTER — Encounter: Payer: Self-pay | Admitting: Gastroenterology

## 2017-10-14 ENCOUNTER — Other Ambulatory Visit: Payer: Medicare Other | Admitting: *Deleted

## 2017-10-14 DIAGNOSIS — E7800 Pure hypercholesterolemia, unspecified: Secondary | ICD-10-CM

## 2017-10-15 LAB — HEPATIC FUNCTION PANEL
ALBUMIN: 4.1 g/dL (ref 3.5–4.8)
ALK PHOS: 89 IU/L (ref 39–117)
ALT: 31 IU/L (ref 0–32)
AST: 24 IU/L (ref 0–40)
BILIRUBIN, DIRECT: 0.16 mg/dL (ref 0.00–0.40)
Bilirubin Total: 0.6 mg/dL (ref 0.0–1.2)
TOTAL PROTEIN: 6.8 g/dL (ref 6.0–8.5)

## 2017-10-15 LAB — LIPID PANEL
CHOL/HDL RATIO: 3.1 ratio (ref 0.0–4.4)
CHOLESTEROL TOTAL: 147 mg/dL (ref 100–199)
HDL: 48 mg/dL (ref >39)
LDL Calculated: 74 mg/dL (ref 0–99)
TRIGLYCERIDES: 125 mg/dL (ref 0–149)
VLDL Cholesterol Cal: 25 mg/dL (ref 5–40)

## 2017-10-16 ENCOUNTER — Telehealth: Payer: Self-pay | Admitting: Interventional Cardiology

## 2017-10-16 NOTE — Telephone Encounter (Signed)
Patient made aware of results. Patient verbalizes understanding.  

## 2017-10-16 NOTE — Telephone Encounter (Signed)
Follow Up: ° ° ° °Returning your call, concerning her lab results. °

## 2017-10-16 NOTE — Telephone Encounter (Signed)
-----   Message from Jettie Booze, MD sent at 10/15/2017  2:47 PM EDT ----- Normal liver and lipids

## 2017-10-23 ENCOUNTER — Ambulatory Visit (AMBULATORY_SURGERY_CENTER): Payer: Medicare Other | Admitting: Gastroenterology

## 2017-10-23 ENCOUNTER — Encounter: Payer: Self-pay | Admitting: Gastroenterology

## 2017-10-23 VITALS — BP 108/64 | HR 66 | Temp 98.4°F | Resp 14 | Ht 64.0 in | Wt 212.0 lb

## 2017-10-23 DIAGNOSIS — D123 Benign neoplasm of transverse colon: Secondary | ICD-10-CM

## 2017-10-23 DIAGNOSIS — Z86010 Personal history of colonic polyps: Secondary | ICD-10-CM

## 2017-10-23 MED ORDER — SODIUM CHLORIDE 0.9 % IV SOLN: 500.0000 mL | Freq: Once | INTRAVENOUS | Status: DC

## 2017-10-23 NOTE — Progress Notes (Signed)
Alert and oriented x3, pleased with MAC, report to RN  

## 2017-10-23 NOTE — Op Note (Signed)
[facility] Patient Name: Betty Fox Procedure Date: 10/23/2017 10:47 AM MRN: [id number] Endoscopist: Mauri Pole , MD Age: 72 Referring MD:  Date of Birth: [birth year] Gender: Female Account #: [id number] Procedure:                Colonoscopy Indications:              High risk colon cancer surveillance: Personal                            history of colonic polyps, Surveillance: Personal                            history of adenomatous polyps on last colonoscopy 3                            years ago, High risk colon cancer surveillance:                            Personal history of adenoma (10 mm or greater in                            size) Medicines:                Monitored Anesthesia Care Procedure:                Pre-Anesthesia Assessment:                           - Prior to the procedure, a History and Physical                            was performed, and patient medications and                            allergies were reviewed. The patient's tolerance of                            previous anesthesia was also reviewed. The risks                            and benefits of the procedure and the sedation                            options and risks were discussed with the patient.                            All questions were answered, and informed consent                            was obtained. Prior Anticoagulants: The patient has                            taken no previous anticoagulant or antiplatelet  agents. ASA Grade Assessment: III - A patient with                            severe systemic disease. After reviewing the risks                            and benefits, the patient was deemed in                            satisfactory condition to undergo the procedure.                           After obtaining informed consent, the colonoscope                            was passed under direct vision. Throughout the                        procedure, the patient's blood pressure, pulse, and                            oxygen saturations were monitored continuously. The                            Model PCF-[id number]) scope was introduced                            through the anus and advanced to the the cecum,                            identified by appendiceal orifice and ileocecal                            valve. The colonoscopy was performed without                            difficulty. The patient tolerated the procedure                            well. The quality of the bowel preparation was                            excellent. The ileocecal valve, appendiceal                            orifice, and rectum were photographed. Scope In: 10:50:06 AM Scope Out: 11:05:58 AM Scope Withdrawal Time: 0 hours 8 minutes 14 seconds  Total Procedure Duration: 0 hours 15 minutes 52 seconds  Findings:                 The perianal and digital rectal examinations were                            normal.  A 2 mm polyp was found in the transverse colon. The                            polyp was sessile. The polyp was removed with a                            cold biopsy forceps. Resection and retrieval were                            complete.                           Multiple small and large-mouthed diverticula were                            found in the sigmoid colon and descending colon.                            There was narrowing of the colon in association                            with the diverticular opening. Peri-diverticular                            erythema was seen. There was evidence of an                            impacted diverticulum.                           Non-bleeding internal hemorrhoids were found during                            retroflexion. The hemorrhoids were medium-sized. Complications:            No immediate complications. Estimated Blood  Loss:     Estimated blood loss was minimal. Impression:               - One 2 mm polyp in the transverse colon, removed                            with a cold biopsy forceps. Resected and retrieved.                           - Severe diverticulosis in the sigmoid colon and in                            the descending colon. There was narrowing of the                            colon in association with the diverticular opening.                            Peri-diverticular erythema was seen. There was  evidence of an impacted diverticulum.                           - Non-bleeding internal hemorrhoids. Recommendation:           - Patient has a contact number available for                            emergencies. The signs and symptoms of potential                            delayed complications were discussed with the                            patient. Return to normal activities tomorrow.                            Written discharge instructions were provided to the                            patient.                           - Resume previous diet.                           - Continue present medications.                           - Await pathology results.                           - No repeat colonoscopy due to age. Mauri Pole, MD 10/23/2017 11:10:46 AM This report has been signed electronically.

## 2017-10-23 NOTE — Patient Instructions (Signed)
YOU HAD AN ENDOSCOPIC PROCEDURE TODAY AT THE [facility]:   Refer to the procedure report that was given to you for any specific questions about what was found during the examination.  If the procedure report does not answer your questions, please call your gastroenterologist to clarify.  If you requested that your care partner not be given the details of your procedure findings, then the procedure report has been included in a sealed envelope for you to review at your convenience later.  YOU SHOULD EXPECT: Some feelings of bloating in the abdomen. Passage of more gas than usual.  Walking can help get rid of the air that was put into your GI tract during the procedure and reduce the bloating. If you had a lower endoscopy (such as a colonoscopy or flexible sigmoidoscopy) you may notice spotting of blood in your stool or on the toilet paper. If you underwent a bowel prep for your procedure, you may not have a normal bowel movement for a few days.  Please Note:  You might notice some irritation and congestion in your nose or some drainage.  This is from the oxygen used during your procedure.  There is no need for concern and it should clear up in a day or so.  SYMPTOMS TO REPORT IMMEDIATELY:   Following lower endoscopy (colonoscopy or flexible sigmoidoscopy):  Excessive amounts of blood in the stool  Significant tenderness or worsening of abdominal pains  Swelling of the abdomen that is new, acute  Fever of 100F or higher  For urgent or emergent issues, a gastroenterologist can be reached at any hour by calling [phone].   DIET:  We do recommend a small meal at first, but then you may proceed to your regular diet.  Drink plenty of fluids but you should avoid alcoholic beverages for 24 hours.  MEDICATIONS: Continue present medications.  Please see handouts given to you by your recovery nurse.  ACTIVITY:  You should plan to take it easy for the rest of today and you should NOT  DRIVE or use heavy machinery until tomorrow (because of the sedation medicines used during the test).    FOLLOW UP: Our staff will call the number listed on your records the next business day following your procedure to check on you and address any questions or concerns that you may have regarding the information given to you following your procedure. If we do not reach you, we will leave a message.  However, if you are feeling well and you are not experiencing any problems, there is no need to return our call.  We will assume that you have returned to your regular daily activities without incident.  If any biopsies were taken you will be contacted by phone or by letter within the next 1-3 weeks.  Please call us at [phone] if you have not heard about the biopsies in 3 weeks.   Thank you for allowing us to provide for your healthcare needs today.   SIGNATURES/CONFIDENTIALITY: You and/or your care partner have signed paperwork which will be entered into your electronic medical record.  These signatures attest to the fact that that the information above on your After Visit Summary has been reviewed and is understood.  Full responsibility of the confidentiality of this discharge information lies with you and/or your care-partner. 

## 2017-10-23 NOTE — Progress Notes (Signed)
Called to room to assist during endoscopic procedure.  Patient ID and intended procedure confirmed with present staff. Received instructions for my participation in the procedure from the performing physician.  

## 2017-10-24 ENCOUNTER — Telehealth: Payer: Self-pay

## 2017-10-24 NOTE — Telephone Encounter (Signed)
  Follow up Call-  Call back number 10/23/2017  Post procedure Call Back phone  # [phone]  Permission to leave phone message Yes  Some recent data might be hidden     Patient questions:  Do you have a fever, pain , or abdominal swelling? No. Pain Score  0 *  Have you tolerated food without any problems? Yes.    Have you been able to return to your normal activities? Yes.    Do you have any questions about your discharge instructions: Diet   No. Medications  No. Follow up visit  No.  Do you have questions or concerns about your Care? No.  Actions: * If pain score is 4 or above: No action needed, pain <4.

## 2017-10-28 ENCOUNTER — Other Ambulatory Visit: Payer: Self-pay | Admitting: Interventional Cardiology

## 2017-10-29 ENCOUNTER — Encounter: Payer: Self-pay | Admitting: Gastroenterology

## 2018-04-14 ENCOUNTER — Other Ambulatory Visit: Payer: Self-pay | Admitting: Interventional Cardiology

## 2018-06-15 ENCOUNTER — Other Ambulatory Visit: Payer: Self-pay | Admitting: Interventional Cardiology

## 2018-06-17 ENCOUNTER — Telehealth: Payer: Self-pay

## 2018-06-17 NOTE — Telephone Encounter (Signed)
Virtual Visit Pre-Appointment Phone Call TELEPHONE CALL NOTE  Betty Fox has been deemed a candidate for a follow-up tele-health visit to limit community exposure during the Covid-19 pandemic. I spoke with the patient via phone to ensure availability of phone/video source, confirm preferred email & phone number, and discuss instructions and expectations.  I reminded Betty Fox to be prepared with any vital sign and/or heart rhythm information that could potentially be obtained via home monitoring, at the time of her visit. I reminded Betty Fox to expect a phone call prior to her visit.  Patient agrees to consent below.  Cleon Gustin, RN 06/17/2018 10:02 AM   FULL LENGTH CONSENT FOR TELE-HEALTH VISIT   I hereby voluntarily request, consent and authorize [facility] and its employed or contracted physicians, physician assistants, nurse practitioners or other licensed health care professionals (the Practitioner), to provide me with telemedicine health care services (the "Services") as deemed necessary by the treating Practitioner. I acknowledge and consent to receive the Services by the Practitioner via telemedicine. I understand that the telemedicine visit will involve communicating with the Practitioner through live audiovisual communication technology and the disclosure of certain medical information by electronic transmission. I acknowledge that I have been given the opportunity to request an in-person assessment or other available alternative prior to the telemedicine visit and am voluntarily participating in the telemedicine visit.  I understand that I have the right to withhold or withdraw my consent to the use of telemedicine in the course of my care at any time, without affecting my right to future care or treatment, and that the Practitioner or I may terminate the telemedicine visit at any time. I understand that I have the right to inspect all information obtained and/or  recorded in the course of the telemedicine visit and may receive copies of available information for a reasonable fee.  I understand that some of the potential risks of receiving the Services via telemedicine include:  Marland Kitchen Delay or interruption in medical evaluation due to technological equipment failure or disruption; . Information transmitted may not be sufficient (e.g. poor resolution of images) to allow for appropriate medical decision making by the Practitioner; and/or  . In rare instances, security protocols could fail, causing a breach of personal health information.  Furthermore, I acknowledge that it is my responsibility to provide information about my medical history, conditions and care that is complete and accurate to the best of my ability. I acknowledge that Practitioner's advice, recommendations, and/or decision may be based on factors not within their control, such as incomplete or inaccurate data provided by me or distortions of diagnostic images or specimens that may result from electronic transmissions. I understand that the practice of medicine is not an exact science and that Practitioner makes no warranties or guarantees regarding treatment outcomes. I acknowledge that I will receive a copy of this consent concurrently upon execution via email to the email address I last provided but may also request a printed copy by calling the office of [facility].    I understand that my insurance will be billed for this visit.   I have read or had this consent read to me. . I understand the contents of this consent, which adequately explains the benefits and risks of the Services being provided via telemedicine.  . I have been provided ample opportunity to ask questions regarding this consent and the Services and have had my questions answered to my satisfaction. . I give  my informed consent for the services to be provided through the use of telemedicine in my medical care  By participating  in this telemedicine visit I agree to the above.

## 2018-06-20 ENCOUNTER — Encounter: Payer: Self-pay | Admitting: Interventional Cardiology

## 2018-06-20 ENCOUNTER — Other Ambulatory Visit: Payer: Self-pay

## 2018-06-20 ENCOUNTER — Telehealth (INDEPENDENT_AMBULATORY_CARE_PROVIDER_SITE_OTHER): Payer: Medicare Other | Admitting: Interventional Cardiology

## 2018-06-20 DIAGNOSIS — I119 Hypertensive heart disease without heart failure: Secondary | ICD-10-CM

## 2018-06-20 DIAGNOSIS — I471 Supraventricular tachycardia: Secondary | ICD-10-CM

## 2018-06-20 DIAGNOSIS — E7800 Pure hypercholesterolemia, unspecified: Secondary | ICD-10-CM | POA: Diagnosis not present

## 2018-06-20 DIAGNOSIS — E119 Type 2 diabetes mellitus without complications: Secondary | ICD-10-CM | POA: Diagnosis not present

## 2018-06-20 MED ORDER — POTASSIUM CHLORIDE ER 10 MEQ PO TBCR: EXTENDED_RELEASE_TABLET | ORAL | 2 refills | Status: DC

## 2018-06-20 NOTE — Patient Instructions (Signed)

## 2018-06-20 NOTE — Progress Notes (Signed)
Virtual Visit via Video Note   This visit type was conducted due to national recommendations for restrictions regarding the COVID-19 Pandemic (e.g. social distancing) in an effort to limit this patient's exposure and mitigate transmission in our community.  Due to her co-morbid illnesses, this patient is at least at moderate risk for complications without adequate follow up.  This format is felt to be most appropriate for this patient at this time.  All issues noted in this document were discussed and addressed.  A limited physical exam was performed with this format.  Please refer to the patient's chart for her consent to telehealth for [facility].   Date:  06/20/2018   ID:  Betty Fox, DOB 1945/03/08, MRN [id number]  Patient Location: Home Provider Location: Home  PCP:  Kathyrn Lass, MD  Cardiologist:  Larae Grooms, MD  Electrophysiologist:  None   Evaluation Performed:  Follow-Up Visit  Chief Complaint:  palpitations  History of Present Illness:    Betty Fox is a 73 y.o. female whohas a history of essential hypertension, hypothyroidism, dyslipidemia,and asthma. She was followed by Dr. Mare Ferrari in the past.  About 10 years ago she had pericarditis. She had not had any recurrent symptoms of pericarditis. She has a history of allergy to ACE inhibitors which cause a cough. He's had a past history of supraventricular tachycardia which she can abort by doing a Valsalva maneuver. He has a history of hypothyroidism followed by endocrinology. She has been diagnosed as Hashimoto's thyroiditis. Dr. Buddy Duty is her endocrinologist. She has a history of mild diabetes. She does not have any history of ischemic heart disease.  In 2015,She lost 30 pounds. She was diagnosed with Sjogrens syndrome.   The patient does not have symptoms concerning for COVID-19 infection (fever, chills, cough, or new shortness of breath).   SHe had flu like sx in Feb that lasted a few months.    She is  feeling better now.   Denies : Exertional Chest pain. Dizziness. Leg edema. Nitroglycerin use. Orthopnea. Palpitations. Paroxysmal nocturnal dyspnea. Shortness of breath. Syncope.   No significant SVT sx.    Past Medical History:  Diagnosis Date  . Allergic rhinitis   . Arthritis   . Asthma   . Diverticulosis   . Dyspepsia   . Eczema   . Hashimoto's thyroiditis   . Hyperlipidemia   . Hypertension   . Hypothyroidism   . Nephrolithiasis   . Osteopenia   . Pericarditis   . Pneumonia   . PSVT (paroxysmal supraventricular tachycardia) ([place])   . Shingles   . Uveitis   . Vitamin D deficiency    Past Surgical History:  Procedure Laterality Date  . BLADDER REPAIR    . BREAST REDUCTION SURGERY Bilateral   . LAPAROSCOPIC ENDOMETRIOSIS FULGURATION    . laparotomy with oopherectomy Left 1979  . MOUTH SURGERY  06/2014  . TUBAL LIGATION    . US ECHOCARDIOGRAPHY  07/27/2009   EF 55-60%     Current Meds  Medication Sig  . amLODipine (NORVASC) 10 MG tablet TAKE 1 TABLET BY MOUTH EVERY DAY  . ARNUITY ELLIPTA 100 MCG/ACT AEPB USE 1 INHALATION ONCE DAILY DURING SEASONS OF DIFFICULTY  . atenolol (TENORMIN) 25 MG tablet Take 1 tablet (25 mg total) by mouth daily. Please keep upcoming appt for future refills. Thank you  . atorvastatin (LIPITOR) 10 MG tablet Take 1 tablet (10 mg total) by mouth 5 days a week  . Beclomethasone Dipropionate (QVAR IN) Inhale  2 puffs into the lungs daily.   Marland Kitchen BOOSTRIX 5-2.5-18.5 injection Inject 0.5 mLs into the muscle once.   . Cholecalciferol (VITAMIN D) 2000 UNITS tablet Take 2,000 Units by mouth daily.  Marland Kitchen desoximetasone (TOPICORT) 0.25 % cream Apply 1 application topically as needed (for exzema).   . EPINEPHrine 0.3 mg/0.3 mL IJ SOAJ injection Inject 0.3 mg into the muscle once.  . fluticasone (FLONASE) 50 MCG/ACT nasal spray Place 2 sprays into both nostrils daily.  . hydrochlorothiazide (HYDRODIURIL) 25 MG tablet TAKE 1 TABLET BY MOUTH EVERY OTHER DAY OR  AS DIRECTED  . potassium chloride (K-DUR) 10 MEQ tablet TAKE 1 TABLET (10 MEQ TOTAL) BY MOUTH 2 (TWO) TIMES DAILY.  . SYNTHROID 88 MCG tablet Take 88 mcg by mouth daily.  . valACYclovir (VALTREX) 1000 MG tablet Take 1,000 mg by mouth as needed (for shingles).    Current Facility-Administered Medications for the 06/20/18 encounter (Telemedicine) with Jettie Booze, MD  Medication  . 0.9 %  sodium chloride infusion     Allergies:   Ampicillin; Ace inhibitors; Calcium carbonate antacid; Prinivil [lisinopril]; Esomeprazole magnesium; Omeprazole magnesium; Penicillins; Ranitidine hcl; Sulfa antibiotics; Sulfur; and Zoledronic acid   Social History   Tobacco Use  . Smoking status: Never Smoker  . Smokeless tobacco: Never Used  Substance Use Topics  . Alcohol use: Yes    Comment: 1 per year  . Drug use: No     Family Hx: The patient's family history is negative for Colon cancer and Rectal cancer. She was adopted.  ROS:   Please see the history of present illness.    Recent flu like illness All other systems reviewed and are negative.   Prior CV studies:   The following studies were reviewed today:  Normal LV function in 2011.  Labs/Other Tests and Data Reviewed:    EKG:  An ECG dated 6/19 was personally reviewed today and demonstrated:  NSR, nonspecific ST changes  Recent Labs: 10/14/2017: ALT 31   Recent Lipid Panel Lab Results  Component Value Date/Time   CHOL 147 10/14/2017 02:10 PM   TRIG 125 10/14/2017 02:10 PM   HDL 48 10/14/2017 02:10 PM   CHOLHDL 3.1 10/14/2017 02:10 PM   LDLCALC 74 10/14/2017 02:10 PM    Wt Readings from Last 3 Encounters:  06/20/18 201 lb 3.2 oz (91.3 kg)  10/23/17 212 lb (96.2 kg)  09/02/17 212 lb 12.8 oz (96.5 kg)     Objective:    Vital Signs:  Ht 5\' 4"  (1.626 m)   Wt 201 lb 3.2 oz (91.3 kg)   BMI 34.54 kg/m    VITAL SIGNS:  reviewed GEN:  no acute distress RESPIRATORY:  normal respiratory effort, symmetric expansion  PSYCH:  normal affect exam limited due to video format  ASSESSMENT & PLAN:    1. SVT: No palpitations.  COntinue to avoid caffeine.  Continue current meds.  2. Diabetes: Limits intake.  Not checking regularly.  Managing sugar with diet.  Was prescribed metformin in the past, but had to reduce it due to upset stomach.  3. Hypertensive heart disease: COntrolled at last check with PMD.  4. Hyperlipidemia: Lipids controlled in 9/19.  Contiue atorvastatin.   COVID-19 Education: The signs and symptoms of COVID-19 were discussed with the patient and how to seek care for testing (follow up with PCP or arrange E-visit).  The importance of social distancing was discussed today.  Time:   Today, I have spent 15 minutes with the patient with  telehealth technology discussing the above problems.     Medication Adjustments/Labs and Tests Ordered: Current medicines are reviewed at length with the patient today.  Concerns regarding medicines are outlined above.   Tests Ordered: No orders of the defined types were placed in this encounter.   Medication Changes: No orders of the defined types were placed in this encounter.   Disposition:  Follow up in 1 year(s)  Signed, Larae Grooms, MD  06/20/2018 3:31 PM    [facility]

## 2018-07-09 ENCOUNTER — Other Ambulatory Visit: Payer: Self-pay | Admitting: Interventional Cardiology

## 2018-07-12 ENCOUNTER — Other Ambulatory Visit: Payer: Self-pay | Admitting: Interventional Cardiology

## 2018-08-26 ENCOUNTER — Telehealth: Payer: Self-pay | Admitting: *Deleted

## 2018-08-26 NOTE — Telephone Encounter (Signed)
   Primary Cardiologist: Larae Grooms, MD  Chart reviewed as part of pre-operative protocol coverage. Patient was contacted 08/26/2018 in reference to pre-operative risk assessment for pending surgery as outlined below.  Betty Fox was last seen on 06/20/18 by Dr. Irish Lack.  Since that day, Betty Fox has done well. She used to walk 1-3 miles as recently as last year without anginal symptoms. Mobility is now limited by bone spurs on her feet which also hinder her balance. However, she reports walking throughout [employer], so she can likely achieve greater than 4.0 METS.   Therefore, based on ACC/AHA guidelines, the patient would be at acceptable risk for the planned procedure without further cardiovascular testing.   I will route this recommendation to the requesting party via Epic fax function and remove from pre-op pool.  Please call with questions.  [place], PA 08/26/2018, 12:52 PM

## 2018-08-26 NOTE — Telephone Encounter (Signed)
   [facility] Pre-operative Risk Assessment    Request for surgical clearance:  1. What type of surgery is being performed? LEFT HEEL SPUR EXCISION   2. When is this surgery scheduled? TBD   3. What type of clearance is required (medical clearance vs. Pharmacy clearance to hold med vs. Both)? MEDICAL  4. Are there any medications that need to be held prior to surgery and how long? NONE LISTED   5. Practice name and name of physician performing surgery? [facility]; DR. Collier Salina [place]F   6. What is your office phone number [phone]    7.   What is your office fax number [phone]  8.   Anesthesia type (None, local, MAC, general) ? NOT LISTED; GENERAL?    Julaine Hua 08/26/2018, 11:35 AM  _________________________________________________________________   (provider comments below)

## 2018-09-12 ENCOUNTER — Other Ambulatory Visit: Payer: Self-pay | Admitting: Interventional Cardiology

## 2019-03-10 ENCOUNTER — Telehealth: Payer: Self-pay | Admitting: *Deleted

## 2019-03-10 DIAGNOSIS — I119 Hypertensive heart disease without heart failure: Secondary | ICD-10-CM

## 2019-03-10 NOTE — Telephone Encounter (Signed)
Our office received a clearance request from Jeannie Done, [place] office today. I left a message to please call as we are needing more detailed information, such as procedure being done, meds to be held if any, anesthesia if any.

## 2019-03-12 NOTE — Telephone Encounter (Signed)
I will place fax from [place]. Betty Fox into Dr. Hassell Fox in box. Looks like the fax is not asking for clearance though is asking for a medication change.

## 2019-03-13 MED ORDER — VALSARTAN 160 MG PO TABS: 160.0000 mg | ORAL_TABLET | Freq: Every day | ORAL | 3 refills | Status: DC

## 2019-03-13 NOTE — Telephone Encounter (Signed)
I am ok with trying valsartan.  Bmet 1 week after starting it.  JV

## 2019-03-13 NOTE — Addendum Note (Signed)
Addended by: Drue Novel I on: 03/13/2019 03:40 PM   Modules accepted: Orders

## 2019-03-13 NOTE — Telephone Encounter (Signed)
Patient made aware that we will switch amlodipine to valsartan 160 mg QD. Patient will return on 3/8 for BMET. Patient will monitor BP in the meantime.

## 2019-03-13 NOTE — Telephone Encounter (Signed)
Fax received from Jeannie Done, [place]. She is asking if amlodpine can be changed to a medicine that is not a CCB. She states that the patient tends to have chronic gingival hyperplasia despite regular periodontal dental care. Will forward to Dr. Irish Lack for recommendations.

## 2019-03-13 NOTE — Telephone Encounter (Signed)
Gingival hyperplasia is listed as an adverse effect in <1% of people. I don't think there is really any way around it. If the dentist seems to think its from medication then we could change the amlodipine to valsartan 160mg  daily.

## 2019-03-13 NOTE — Telephone Encounter (Signed)
Any suggestions regarding amlodipine and gingival hyperplasia?  Thanks JV

## 2019-03-23 ENCOUNTER — Other Ambulatory Visit: Payer: Self-pay

## 2019-03-23 ENCOUNTER — Other Ambulatory Visit: Payer: Medicare PPO | Admitting: *Deleted

## 2019-03-23 ENCOUNTER — Encounter (INDEPENDENT_AMBULATORY_CARE_PROVIDER_SITE_OTHER): Payer: Self-pay

## 2019-03-23 DIAGNOSIS — I119 Hypertensive heart disease without heart failure: Secondary | ICD-10-CM

## 2019-03-23 LAB — BASIC METABOLIC PANEL
BUN/Creatinine Ratio: 10 — ABNORMAL LOW (ref 12–28)
BUN: 8 mg/dL (ref 8–27)
CO2: 25 mmol/L (ref 20–29)
Calcium: 9.9 mg/dL (ref 8.7–10.3)
Chloride: 99 mmol/L (ref 96–106)
Creatinine, Ser: 0.82 mg/dL (ref 0.57–1.00)
GFR calc Af Amer: 81 mL/min/{1.73_m2} (ref >59)
GFR calc non Af Amer: 70 mL/min/{1.73_m2} (ref >59)
Glucose: 376 mg/dL — ABNORMAL HIGH (ref 65–99)
Potassium: 4.2 mmol/L (ref 3.5–5.2)
Sodium: 139 mmol/L (ref 134–144)

## 2019-04-13 ENCOUNTER — Other Ambulatory Visit (HOSPITAL_BASED_OUTPATIENT_CLINIC_OR_DEPARTMENT_OTHER): Payer: Self-pay | Admitting: Family Medicine

## 2019-04-13 ENCOUNTER — Other Ambulatory Visit: Payer: Self-pay

## 2019-04-13 ENCOUNTER — Ambulatory Visit (HOSPITAL_BASED_OUTPATIENT_CLINIC_OR_DEPARTMENT_OTHER)
Admission: RE | Admit: 2019-04-13 | Discharge: 2019-04-13 | Disposition: A | Discharge status: Home / Self Care | Payer: Medicare PPO | Source: Ambulatory Visit | Attending: Family Medicine | Admitting: Family Medicine

## 2019-04-13 DIAGNOSIS — R519 Headache, unspecified: Secondary | ICD-10-CM | POA: Insufficient documentation

## 2019-04-13 DIAGNOSIS — D352 Benign neoplasm of pituitary gland: Secondary | ICD-10-CM | POA: Insufficient documentation

## 2019-05-01 ENCOUNTER — Other Ambulatory Visit: Payer: Self-pay

## 2019-05-01 ENCOUNTER — Encounter: Payer: Self-pay | Admitting: Interventional Cardiology

## 2019-05-01 ENCOUNTER — Ambulatory Visit: Payer: Medicare PPO | Admitting: Interventional Cardiology

## 2019-05-01 VITALS — BP 162/98 | HR 50 | Ht 63.0 in | Wt 203.8 lb

## 2019-05-01 DIAGNOSIS — E119 Type 2 diabetes mellitus without complications: Secondary | ICD-10-CM

## 2019-05-01 DIAGNOSIS — I119 Hypertensive heart disease without heart failure: Secondary | ICD-10-CM | POA: Diagnosis not present

## 2019-05-01 DIAGNOSIS — E7800 Pure hypercholesterolemia, unspecified: Secondary | ICD-10-CM

## 2019-05-01 DIAGNOSIS — I471 Supraventricular tachycardia: Secondary | ICD-10-CM | POA: Diagnosis not present

## 2019-05-01 MED ORDER — AMLODIPINE BESYLATE 10 MG PO TABS: 10.0000 mg | ORAL_TABLET | Freq: Every day | ORAL | 3 refills | Status: DC

## 2019-05-01 NOTE — Patient Instructions (Signed)
Medication Instructions:  Your physician has recommended you make the following change in your medication:   1. STOP: valsartan  2. START: amlodipine 10 mg once a day  *If you need a refill on your cardiac medications before your next appointment, please call your pharmacy*   Lab Work: None ordered  If you have labs (blood work) drawn today and your tests are completely normal, you will receive your results only by: Marland Kitchen MyChart Message (if you have MyChart) OR . A paper copy in the mail If you have any lab test that is abnormal or we need to change your treatment, we will call you to review the results.   Testing/Procedures: None ordered   Follow-Up: At [facility], you and your health needs are our priority.  As part of our continuing mission to provide you with exceptional heart care, we have created designated Provider Care Teams.  These Care Teams include your primary Cardiologist (physician) and Advanced Practice Providers (APPs -  Physician Assistants and Nurse Practitioners) who all work together to provide you with the care you need, when you need it.  We recommend signing up for the patient portal called "MyChart".  Sign up information is provided on this After Visit Summary.  MyChart is used to connect with patients for Virtual Visits (Telemedicine).  Patients are able to view lab/test results, encounter notes, upcoming appointments, etc.  Non-urgent messages can be sent to your provider as well.   To learn more about what you can do with MyChart, go to [email or web address].    Your next appointment:   6 month(s)  The format for your next appointment:   In Person  Provider:   You may see Larae Grooms, MD or one of the following Advanced Practice Providers on your designated Care Team:    Melina Copa, PA-C  Ermalinda Barrios, PA-C    Other Instructions

## 2019-05-01 NOTE — Progress Notes (Signed)
Cardiology Office Note   Date:  05/01/2019   ID:  Betty Fox, DOB 23-Feb-1945, MRN [id number]  PCP:  Kathyrn Lass, MD    No chief complaint on file.  SVT  Wt Readings from Last 3 Encounters:  05/01/19 203 lb 12.8 oz (92.4 kg)  06/20/18 201 lb 3.2 oz (91.3 kg)  10/23/17 212 lb (96.2 kg)       History of Present Illness: Betty Fox is a 74 y.o. female  whohas a history of essential hypertension, hypothyroidism, dyslipidemia,and asthma.She was followed by Dr. Mare Ferrari in the past.About 10 years ago she had pericarditis. She had not had any recurrent symptoms of pericarditis. She has a history of allergy to ACE inhibitors which cause a cough. He's had a past history of supraventricular tachycardia which she can abort by doing a Valsalva maneuver. He has a history of hypothyroidism followed by endocrinology. She has been diagnosed as Hashimoto's thyroiditis. Dr. Buddy Duty is her endocrinologist. She has a history of mild diabetes. She does not have any history of ischemic heart disease.  In 2015,She lost 30 pounds. She was diagnosed with Sjogrens syndrome.  At the last visit in 2020, SVT was well controlled.  She had been on amlodipine in the past but this was changed her med to valsartan due to the possibility of gingival hyperplasia in 2/21.  Headaches got worse since that time.  She also had her COVID shot at the same time.  Of late, she has had some headaches- starting when the BP med was changed.  She will be seeing Dr. Leta Baptist regarding the headaches.  She has migraines with associated visual changes.    Her shaking feeling has also gotten worse.  She had an MRI.  She is awaiting results.   Denies : Chest pain. Dizziness. Leg edema. Nitroglycerin use. Orthopnea. Paroxysmal nocturnal dyspnea. Shortness of breath. Syncope.       Past Medical History:  Diagnosis Date  . Allergic rhinitis   . Arthritis   . Asthma   . Diverticulosis   . Dyspepsia   . Eczema    . Hashimoto's thyroiditis   . Hyperlipidemia   . Hypertension   . Hypothyroidism   . Nephrolithiasis   . Osteopenia   . Pericarditis   . Pneumonia   . PSVT (paroxysmal supraventricular tachycardia) ([place])   . Shingles   . Uveitis   . Vitamin D deficiency     Past Surgical History:  Procedure Laterality Date  . BLADDER REPAIR    . BREAST REDUCTION SURGERY Bilateral   . LAPAROSCOPIC ENDOMETRIOSIS FULGURATION    . laparotomy with oopherectomy Left 1979  . MOUTH SURGERY  06/2014  . TUBAL LIGATION    . US ECHOCARDIOGRAPHY  07/27/2009   EF 55-60%     Current Outpatient Medications  Medication Sig Dispense Refill  . ARNUITY ELLIPTA 100 MCG/ACT AEPB USE 1 INHALATION ONCE DAILY DURING SEASONS OF DIFFICULTY  3  . atenolol (TENORMIN) 25 MG tablet Take 1 tablet (25 mg total) by mouth daily. 90 tablet 3  . atorvastatin (LIPITOR) 10 MG tablet Take 1 tablet (10 mg total) by mouth 5 days a week 30 tablet 11  . Beclomethasone Dipropionate (QVAR IN) Inhale 2 puffs into the lungs daily.     Marland Kitchen BOOSTRIX 5-2.5-18.5 injection Inject 0.5 mLs into the muscle once.     . Cholecalciferol (VITAMIN D) 2000 UNITS tablet Take 2,000 Units by mouth daily.    Marland Kitchen desoximetasone (TOPICORT) 0.25 %  cream Apply 1 application topically as needed (for exzema).     . EPINEPHrine 0.3 mg/0.3 mL IJ SOAJ injection Inject 0.3 mg into the muscle once.    . fluticasone (FLONASE) 50 MCG/ACT nasal spray Place 2 sprays into both nostrils daily.  5  . hydrochlorothiazide (HYDRODIURIL) 25 MG tablet TAKE 1 TABLET BY MOUTH EVERY OTHER DAY OR AS DIRECTED 45 tablet 2  . levalbuterol (XOPENEX HFA) 45 MCG/ACT inhaler levalbuterol HFA 45 mcg/actuation aerosol inhaler  INHALE 1 PUFF EVERY 4 6 HRS AS NEEDED FOR COUGH, SHORTNESS OF BREATH, OR WHEEZING    . metFORMIN (GLUCOPHAGE) 500 MG tablet Take 500 mg by mouth daily with breakfast.    . potassium chloride (K-DUR) 10 MEQ tablet TAKE 1 TABLET (10 MEQ TOTAL) BY MOUTH 2 (TWO) TIMES DAILY.  180 tablet 2  . SYNTHROID 88 MCG tablet Take 88 mcg by mouth daily.  5  . valsartan (DIOVAN) 160 MG tablet Take 1 tablet (160 mg total) by mouth daily. 90 tablet 3   Current Facility-Administered Medications  Medication Dose Route Frequency Provider Last Rate Last Admin  . 0.9 %  sodium chloride infusion  500 mL Intravenous Once Nandigam, Kavitha V, MD        Allergies:   Ampicillin, Ace inhibitors, Calcium carbonate antacid, Prinivil [lisinopril], Esomeprazole magnesium, Omeprazole magnesium, Penicillins, Ranitidine hcl, Sulfa antibiotics, Sulfur, and Zoledronic acid    Social History:  The patient  reports that she has never smoked. She has never used smokeless tobacco. She reports current alcohol use. She reports that she does not use drugs.   Family History:  The patient's family history is not on file. She was adopted.    ROS:  Please see the history of present illness.   Otherwise, review of systems are positive for .   All other systems are reviewed and negative.    PHYSICAL EXAM: VS:  BP (!) 162/98   Pulse (!) 50   Ht 5\' 3"  (1.6 m)   Wt 203 lb 12.8 oz (92.4 kg)   SpO2 96%   BMI 36.10 kg/m  , BMI Body mass index is 36.1 kg/m. GEN: Well nourished, well developed, in no acute distress  HEENT: normal  Neck: no JVD, carotid bruits, or masses Cardiac: RRR; no murmurs, rubs, or gallops,no edema  Respiratory:  clear to auscultation bilaterally, normal work of breathing GI: soft, nontender, nondistended, + BS MS: no deformity or atrophy  Skin: warm and dry, no rash Neuro:  Strength and sensation are intact Psych: euthymic mood, full affect   EKG:   The ekg ordered today demonstrates sinus bradycardia, diffuse ST changes   Recent Labs: 03/23/2019: BUN 8; Creatinine, Ser 0.82; Potassium 4.2; Sodium 139   Lipid Panel    Component Value Date/Time   CHOL 147 10/14/2017 1410   TRIG 125 10/14/2017 1410   HDL 48 10/14/2017 1410   CHOLHDL 3.1 10/14/2017 1410   LDLCALC 74  10/14/2017 1410     Other studies Reviewed: Additional studies/ records that were reviewed today with results demonstrating: LDL increased in 2020 compared to 2019 from PMD check..   ASSESSMENT AND PLAN:  1. SVT: Rare sx.  Well controlled.  Continue atenolol. 2. Diabetes:A1C 6.7. Continue metformin. 3. Hypertensive heart disease:  Bad headaches with valsartan instead of amlodipine.  Will stop valsartan and go back to amlodipine 10 mg daily.  Hopefully, this will help her headaches.  Gingival hyperplasia was reported in < 1% of patients.   4. Hyperlipidemia:  LDL 127 in 08/2018, increased from 74 in 2019.  She was having leg cramps so she decreased the frequency of the atorvastatin.  She will increase frequency back to 4-5 x/week.  If leg pains persist, we can try rosuvastatin for lipid lowering therapy.     Current medicines are reviewed at length with the patient today.  The patient concerns regarding her medicines were addressed.  The following changes have been made: Stop valsartan, start amlodipine  Labs/ tests ordered today include: none No orders of the defined types were placed in this encounter.   Recommend 150 minutes/week of aerobic exercise Low fat, low carb, high fiber diet recommended  Disposition:   FU in 6 months   Signed, Larae Grooms, MD  05/01/2019 9:38 AM    [facility] [place], [place], [place]  [address] Phone: [phone]; Fax: [phone]

## 2019-05-05 NOTE — Addendum Note (Signed)
Addended by: Gar Ponto on: 05/05/2019 06:58 AM   Modules accepted: Orders

## 2019-05-19 ENCOUNTER — Encounter: Payer: Self-pay | Admitting: *Deleted

## 2019-05-20 ENCOUNTER — Ambulatory Visit: Payer: Medicare PPO | Admitting: Diagnostic Neuroimaging

## 2019-05-20 ENCOUNTER — Encounter: Payer: Self-pay | Admitting: Diagnostic Neuroimaging

## 2019-05-20 ENCOUNTER — Other Ambulatory Visit: Payer: Self-pay

## 2019-05-20 VITALS — BP 139/81 | HR 55 | Temp 97.8°F | Ht 63.5 in | Wt 201.0 lb

## 2019-05-20 DIAGNOSIS — G250 Essential tremor: Secondary | ICD-10-CM

## 2019-05-20 DIAGNOSIS — G43109 Migraine with aura, not intractable, without status migrainosus: Secondary | ICD-10-CM

## 2019-05-20 MED ORDER — PRIMIDONE 50 MG PO TABS: 25.0000 mg | ORAL_TABLET | Freq: Two times a day (BID) | ORAL | 3 refills | Status: DC

## 2019-05-20 NOTE — Patient Instructions (Addendum)
-   monitor migraine   - try primidone 25mg  at bedtime; may increase up to 50mg  twice a day; for tremor

## 2019-05-20 NOTE — Progress Notes (Signed)
[facility]  PATIENT: Betty Fox DOB: [birth year]  REFERRING CLINICIAN: Kathyrn Lass, MD HISTORY FROM: patient  REASON FOR VISIT: new consult    HISTORICAL  CHIEF COMPLAINT:  Chief Complaint  Patient presents with  . Essential Tremor    rm 7, last seen 2014, "migraine constantly, loss of vision right side, ringing in ear, difficulty recalling words"  . Balance issues    HISTORY OF PRESENT ILLNESS:   UPDATE (05/20/19, VRP): Since last visit, was doing well until February 2021 when she started having increasing headaches.  She describes throbbing headaches with nausea, sensitive to light and sound, sinks colors and spots, sometimes losing vision.  Patient had some mild headaches when she was younger.  Family history of migraine in her daughter.  Patient has had some ringing in the ears.  Her tremor also is gradually worsened.  Patient had CT of the head which is unremarkable.  UPDATE 05/13/12: Since last visit patient had one episode of significant tremor while at church. Patient got up to go to the alter and began to have whole-body tremors. Patient did not lose consciousness. Symptoms last 45 minutes. She went downstairs had something to eat. Symptoms resolved on their own. Today patient feels well. No significant tremor. She has noted deterioration of her handwriting over time.  Regarding her MRI brain study, it shows a small possible pituitary macroadenoma. She's followed up with endocrinology for blood testing which apparently has been stable.  PRIOR HPI (12/28/11): 74 year old female with history of hypertension, Hashimoto's thyroiditis with hypothyroidism, asthma, idiopathic hyperprolactinemia, supraventricular tachycardia, here for evaluation of tremor.  Patient reports history of mild postural and action tremor of left upper extremity since 1999 when she was diagnosed with acute pericarditis. Tremor has gradually progressed over that time. She also has  intermittent head tremor. She does not notice much resting tremor. No balance or gait difficulties subjectively.   REVIEW OF SYSTEMS: Full 14 system review of systems performed and negative with exception of: as per HPI.  ALLERGIES: Allergies  Allergen Reactions  . Ampicillin Hives and Swelling  . Ace Inhibitors Cough  . Calcium Carbonate Antacid Hives  . Prinivil [Lisinopril] Cough  . Esomeprazole Magnesium   . Omeprazole Magnesium Hives  . Penicillins Hives  . Sulfa Antibiotics   . Sulfur Itching  . Zoledronic Acid     Osteonecrosis of jaw  . Ranitidine Hcl Rash    HOME MEDICATIONS: Outpatient Medications Prior to Visit  Medication Sig Dispense Refill  . amLODipine (NORVASC) 10 MG tablet Take 1 tablet (10 mg total) by mouth daily. 90 tablet 3  . ARNUITY ELLIPTA 100 MCG/ACT AEPB USE 1 INHALATION ONCE DAILY DURING SEASONS OF DIFFICULTY  3  . atenolol (TENORMIN) 25 MG tablet Take 1 tablet (25 mg total) by mouth daily. 90 tablet 3  . atorvastatin (LIPITOR) 10 MG tablet Take 1 tablet (10 mg total) by mouth 5 days a week 30 tablet 11  . Beclomethasone Dipropionate (QVAR IN) Inhale 2 puffs into the lungs daily.     Marland Kitchen BOOSTRIX 5-2.5-18.5 injection Inject 0.5 mLs into the muscle once.     . Cholecalciferol (VITAMIN D) 2000 UNITS tablet Take 2,000 Units by mouth daily.    Marland Kitchen desoximetasone (TOPICORT) 0.25 % cream Apply 1 application topically as needed (for exzema).     . EPINEPHrine 0.3 mg/0.3 mL IJ SOAJ injection Inject 0.3 mg into the muscle once.    . fluticasone (FLONASE) 50 MCG/ACT nasal spray Place 2 sprays  into both nostrils daily.  5  . hydrochlorothiazide (HYDRODIURIL) 25 MG tablet TAKE 1 TABLET BY MOUTH EVERY OTHER DAY OR AS DIRECTED 45 tablet 2  . levalbuterol (XOPENEX HFA) 45 MCG/ACT inhaler levalbuterol HFA 45 mcg/actuation aerosol inhaler  INHALE 1 PUFF EVERY 4 6 HRS AS NEEDED FOR COUGH, SHORTNESS OF BREATH, OR WHEEZING    . potassium chloride (K-DUR) 10 MEQ tablet TAKE 1  TABLET (10 MEQ TOTAL) BY MOUTH 2 (TWO) TIMES DAILY. 180 tablet 2  . SYNTHROID 88 MCG tablet Take 88 mcg by mouth daily.  5  . metFORMIN (GLUCOPHAGE) 500 MG tablet Take 500 mg by mouth daily with breakfast.     Facility-Administered Medications Prior to Visit  Medication Dose Route Frequency Provider Last Rate Last Admin  . 0.9 %  sodium chloride infusion  500 mL Intravenous Once Nandigam, Venia Minks, MD        PAST MEDICAL HISTORY: Past Medical History:  Diagnosis Date  . Allergic rhinitis   . Arthritis   . Asthma   . Balance problem   . Colitis    hx of  . Diverticulosis   . Dyspepsia   . Eczema   . Hashimoto's thyroiditis   . Headache   . Hyperlipidemia   . Hypertension   . Hypothyroidism   . Kidney stones   . Nephrolithiasis   . Osteopenia   . Pericarditis 1999  . Pneumonia   . PSVT (paroxysmal supraventricular tachycardia) ([place])   . Shingles   . Sjogren's syndrome ([place])   . Tremors of nervous system   . Uveitis   . Vitamin D deficiency     PAST SURGICAL HISTORY: Past Surgical History:  Procedure Laterality Date  . BLADDER REPAIR    . BREAST REDUCTION SURGERY Bilateral   . LAPAROSCOPIC ENDOMETRIOSIS FULGURATION    . laparotomy with oopherectomy Left 1979  . MOUTH SURGERY  06/2014  . TUBAL LIGATION    . US ECHOCARDIOGRAPHY  07/27/2009   EF 55-60%    FAMILY HISTORY: Family History  Adopted: Yes  Problem Relation Age of Onset  . Colon cancer Neg Hx   . Rectal cancer Neg Hx     SOCIAL HISTORY: Social History   Socioeconomic History  . Marital status: Married    Spouse name: Not on file  . Number of children: 1  . Years of education: grad school  . Highest education level: Not on file  Occupational History  . Occupation: retired Pharmacist, hospital  Tobacco Use  . Smoking status: Never Smoker  . Smokeless tobacco: Never Used  Substance and Sexual Activity  . Alcohol use: Yes    Comment: 1 per year  . Drug use: Never  . Sexual activity: Not on file  Other  Topics Concern  . Not on file  Social History Narrative   Lives with husband   Caffeine- coffee 1 daily, occas tea   Social Determinants of Health   Financial Resource Strain:   . Difficulty of Paying Living Expenses:   Food Insecurity:   . Worried About Charity fundraiser in the Last Year:   . Arboriculturist in the Last Year:   Transportation Needs:   . Film/video editor (Medical):   Marland Kitchen Lack of Transportation (Non-Medical):   Physical Activity:   . Days of Exercise per Week:   . Minutes of Exercise per Session:   Stress:   . Feeling of Stress :   Social Connections:   . Frequency of  Communication with Friends and Family:   . Frequency of Social Gatherings with Friends and Family:   . Attends Religious Services:   . Active Member of Clubs or Organizations:   . Attends Archivist Meetings:   Marland Kitchen Marital Status:   Intimate Partner Violence:   . Fear of Current or Ex-Partner:   . Emotionally Abused:   Marland Kitchen Physically Abused:   . Sexually Abused:      PHYSICAL EXAM  GENERAL EXAM/CONSTITUTIONAL: Vitals:  Vitals:   05/20/19 1411  BP: 139/81  Pulse: (!) 55  Temp: 97.8 F (36.6 C)  Weight: 201 lb (91.2 kg)  Height: 5' 3.5" (1.613 m)     Body mass index is 35.05 kg/m. Wt Readings from Last 3 Encounters:  05/20/19 201 lb (91.2 kg)  05/01/19 203 lb 12.8 oz (92.4 kg)  06/20/18 201 lb 3.2 oz (91.3 kg)     Patient is in no distress; well developed, nourished and groomed; neck is supple  CARDIOVASCULAR:  Examination of carotid arteries is normal; no carotid bruits  Regular rate and rhythm, no murmurs  Examination of peripheral vascular system by observation and palpation is normal  EYES:  Ophthalmoscopic exam of optic discs and posterior segments is normal; no papilledema or hemorrhages; PHOTOSENSITIVE  No exam data present  MUSCULOSKELETAL:  Gait, strength, tone, movements noted in Neurologic exam below  NEUROLOGIC: MENTAL STATUS:  No  flowsheet data found.  awake, alert, oriented to person, place and time  recent and remote memory intact  normal attention and concentration  language fluent, comprehension intact, naming intact  fund of knowledge appropriate  CRANIAL NERVE:   2nd - no papilledema on fundoscopic exam  2nd, 3rd, 4th, 6th - pupils equal and reactive to light, visual fields full to confrontation, extraocular muscles intact, no nystagmus  5th - facial sensation symmetric  7th - facial strength symmetric  8th - hearing intact  9th - palate elevates symmetrically, uvula midline  11th - shoulder shrug symmetric  12th - tongue protrusion midline  MOTOR:   POSTURAL TREMOR IN BUE AND HEAD / MOUTH  normal bulk and tone, full strength in the BUE, BLE  SENSORY:   normal and symmetric to light touch, temperature, vibration  COORDINATION:   finger-nose-finger, fine finger movements normal  REFLEXES:   deep tendon reflexes TRACE and symmetric  GAIT/STATION:   ANTALGIC GAIT; SLOW AND CAUTIOUS     DIAGNOSTIC DATA (LABS, IMAGING, TESTING) - I reviewed patient records, labs, notes, testing and imaging myself where available.  Lab Results  Component Value Date   WBC 8.1 08/27/2012   HGB 14.9 08/27/2012   HCT 45.1 08/27/2012   MCV 95.9 08/27/2012   PLT 212.0 08/27/2012      Component Value Date/Time   NA 139 03/23/2019 1124   K 4.2 03/23/2019 1124   CL 99 03/23/2019 1124   CO2 25 03/23/2019 1124   GLUCOSE 376 (H) 03/23/2019 1124   GLUCOSE 123 (H) 03/18/2008 0700   BUN 8 03/23/2019 1124   CREATININE 0.82 03/23/2019 1124   CALCIUM 9.9 03/23/2019 1124   PROT 6.8 10/14/2017 1410   ALBUMIN 4.1 10/14/2017 1410   AST 24 10/14/2017 1410   ALT 31 10/14/2017 1410   ALKPHOS 89 10/14/2017 1410   BILITOT 0.6 10/14/2017 1410   GFRNONAA 70 03/23/2019 1124   GFRAA 81 03/23/2019 1124   Lab Results  Component Value Date   CHOL 147 10/14/2017   HDL 48 10/14/2017   LDLCALC 74  10/14/2017   TRIG 125 10/14/2017   CHOLHDL 3.1 10/14/2017   No results found for: HGBA1C No results found for: VITAMINB12 No results found for: TSH   04/13/19 CT head  - No acute intracranial abnormality.    ASSESSMENT AND PLAN  74 y.o. year old female here with:  Dx:  1. Migraine with aura and without status migrainosus, not intractable   2. Essential tremor     PLAN:  MIGRAINE WITH AURA PREVENTION  LIFESTYLE CHANGES -Stop or avoid smoking -Decrease or avoid caffeine / alcohol -Eat and sleep on a regular schedule -Exercise several times per week - consider topiramate 50mg  at bedtime; after 1-2 weeks increase to 50mg  twice a day; drink plenty of water - consider 2nd line: - erenumab (Aimovig) 70mg  monthly (may increase to 140mg  monthly) - fremanezumab (Ajovy) 225mg  monthly (or 675mg  every 3 months) - galazanezumab (Emgality) 240mg  loading dose; then 120mg  monthly  MIGRAINE WITH AURA RESCUE  - ibuprofen, tylenol as needed - consider rimegepant (Nurtec) 75mg  as needed for breakthrough headache; max 8 per month  ESSENTIAL TREMOR  - try primidone 25mg  at bedtime; may increase up to 50mg  twice a day; for tremor  PITUITARY MICROADENOMA - follow up with endocrinology   Meds ordered this encounter  Medications  . primidone (MYSOLINE) 50 MG tablet    Sig: Take 0.5-1 tablets (25-50 mg total) by mouth in the morning and at bedtime.    Dispense:  60 tablet    Refill:  3   Return in about 6 months (around 11/20/2019).    Penni Bombard, MD [id number], 0000000 PM Certified in Neurology, Neurophysiology and Neuroimaging  [facility] [address], [place], [place] [address] [phone]

## 2019-06-19 ENCOUNTER — Other Ambulatory Visit: Payer: Self-pay | Admitting: Interventional Cardiology

## 2019-06-29 ENCOUNTER — Other Ambulatory Visit: Payer: Self-pay | Admitting: Interventional Cardiology

## 2019-07-08 ENCOUNTER — Other Ambulatory Visit: Payer: Self-pay | Admitting: Interventional Cardiology

## 2019-07-10 DIAGNOSIS — E039 Hypothyroidism, unspecified: Secondary | ICD-10-CM | POA: Diagnosis not present

## 2019-07-10 DIAGNOSIS — E063 Autoimmune thyroiditis: Secondary | ICD-10-CM | POA: Diagnosis not present

## 2019-07-18 ENCOUNTER — Other Ambulatory Visit: Payer: Self-pay | Admitting: Interventional Cardiology

## 2019-07-20 DIAGNOSIS — K219 Gastro-esophageal reflux disease without esophagitis: Secondary | ICD-10-CM | POA: Diagnosis not present

## 2019-07-20 DIAGNOSIS — R32 Unspecified urinary incontinence: Secondary | ICD-10-CM | POA: Diagnosis not present

## 2019-07-20 DIAGNOSIS — Z888 Allergy status to other drugs, medicaments and biological substances status: Secondary | ICD-10-CM | POA: Diagnosis not present

## 2019-07-20 DIAGNOSIS — Z882 Allergy status to sulfonamides status: Secondary | ICD-10-CM | POA: Diagnosis not present

## 2019-07-20 DIAGNOSIS — M1990 Unspecified osteoarthritis, unspecified site: Secondary | ICD-10-CM | POA: Diagnosis not present

## 2019-07-20 DIAGNOSIS — J45909 Unspecified asthma, uncomplicated: Secondary | ICD-10-CM | POA: Diagnosis not present

## 2019-07-20 DIAGNOSIS — E039 Hypothyroidism, unspecified: Secondary | ICD-10-CM | POA: Diagnosis not present

## 2019-07-20 DIAGNOSIS — I471 Supraventricular tachycardia: Secondary | ICD-10-CM | POA: Diagnosis not present

## 2019-07-20 DIAGNOSIS — E785 Hyperlipidemia, unspecified: Secondary | ICD-10-CM | POA: Diagnosis not present

## 2019-07-20 DIAGNOSIS — I10 Essential (primary) hypertension: Secondary | ICD-10-CM | POA: Diagnosis not present

## 2019-07-23 DIAGNOSIS — Z1231 Encounter for screening mammogram for malignant neoplasm of breast: Secondary | ICD-10-CM | POA: Diagnosis not present

## 2019-07-23 DIAGNOSIS — Z01419 Encounter for gynecological examination (general) (routine) without abnormal findings: Secondary | ICD-10-CM | POA: Diagnosis not present

## 2019-07-23 DIAGNOSIS — K5900 Constipation, unspecified: Secondary | ICD-10-CM | POA: Diagnosis not present

## 2019-07-23 DIAGNOSIS — R002 Palpitations: Secondary | ICD-10-CM | POA: Diagnosis not present

## 2019-07-23 DIAGNOSIS — Z6835 Body mass index (BMI) 35.0-35.9, adult: Secondary | ICD-10-CM | POA: Diagnosis not present

## 2019-07-23 DIAGNOSIS — N760 Acute vaginitis: Secondary | ICD-10-CM | POA: Diagnosis not present

## 2019-07-23 DIAGNOSIS — L293 Anogenital pruritus, unspecified: Secondary | ICD-10-CM | POA: Diagnosis not present

## 2019-08-12 ENCOUNTER — Other Ambulatory Visit: Payer: Self-pay | Admitting: Diagnostic Neuroimaging

## 2019-08-17 DIAGNOSIS — J029 Acute pharyngitis, unspecified: Secondary | ICD-10-CM | POA: Diagnosis not present

## 2019-08-17 DIAGNOSIS — R05 Cough: Secondary | ICD-10-CM | POA: Diagnosis not present

## 2019-08-17 DIAGNOSIS — J22 Unspecified acute lower respiratory infection: Secondary | ICD-10-CM | POA: Diagnosis not present

## 2019-08-17 DIAGNOSIS — Z03818 Encounter for observation for suspected exposure to other biological agents ruled out: Secondary | ICD-10-CM | POA: Diagnosis not present

## 2019-08-19 DIAGNOSIS — Z8709 Personal history of other diseases of the respiratory system: Secondary | ICD-10-CM | POA: Diagnosis not present

## 2019-08-19 DIAGNOSIS — J40 Bronchitis, not specified as acute or chronic: Secondary | ICD-10-CM | POA: Diagnosis not present

## 2019-08-19 DIAGNOSIS — R05 Cough: Secondary | ICD-10-CM | POA: Diagnosis not present

## 2019-08-19 DIAGNOSIS — B349 Viral infection, unspecified: Secondary | ICD-10-CM | POA: Diagnosis not present

## 2019-08-20 DIAGNOSIS — E1169 Type 2 diabetes mellitus with other specified complication: Secondary | ICD-10-CM | POA: Diagnosis not present

## 2019-08-20 DIAGNOSIS — E7800 Pure hypercholesterolemia, unspecified: Secondary | ICD-10-CM | POA: Diagnosis not present

## 2019-08-20 DIAGNOSIS — Z0000 Encounter for general adult medical examination without abnormal findings: Secondary | ICD-10-CM | POA: Diagnosis not present

## 2019-08-20 DIAGNOSIS — Z6835 Body mass index (BMI) 35.0-35.9, adult: Secondary | ICD-10-CM | POA: Diagnosis not present

## 2019-09-17 DIAGNOSIS — N958 Other specified menopausal and perimenopausal disorders: Secondary | ICD-10-CM | POA: Diagnosis not present

## 2019-09-17 DIAGNOSIS — M8588 Other specified disorders of bone density and structure, other site: Secondary | ICD-10-CM | POA: Diagnosis not present

## 2019-09-30 DIAGNOSIS — Z91011 Allergy to milk products: Secondary | ICD-10-CM | POA: Diagnosis not present

## 2019-09-30 DIAGNOSIS — K219 Gastro-esophageal reflux disease without esophagitis: Secondary | ICD-10-CM | POA: Diagnosis not present

## 2019-09-30 DIAGNOSIS — J4530 Mild persistent asthma, uncomplicated: Secondary | ICD-10-CM | POA: Diagnosis not present

## 2019-09-30 DIAGNOSIS — J3089 Other allergic rhinitis: Secondary | ICD-10-CM | POA: Diagnosis not present

## 2019-10-01 DIAGNOSIS — E669 Obesity, unspecified: Secondary | ICD-10-CM | POA: Diagnosis not present

## 2019-10-01 DIAGNOSIS — M3500 Sicca syndrome, unspecified: Secondary | ICD-10-CM | POA: Diagnosis not present

## 2019-10-01 DIAGNOSIS — M150 Primary generalized (osteo)arthritis: Secondary | ICD-10-CM | POA: Diagnosis not present

## 2019-10-01 DIAGNOSIS — H209 Unspecified iridocyclitis: Secondary | ICD-10-CM | POA: Diagnosis not present

## 2019-10-01 DIAGNOSIS — Z6835 Body mass index (BMI) 35.0-35.9, adult: Secondary | ICD-10-CM | POA: Diagnosis not present

## 2019-10-01 DIAGNOSIS — R768 Other specified abnormal immunological findings in serum: Secondary | ICD-10-CM | POA: Diagnosis not present

## 2019-10-21 DIAGNOSIS — N3946 Mixed incontinence: Secondary | ICD-10-CM | POA: Diagnosis not present

## 2019-10-21 DIAGNOSIS — N281 Cyst of kidney, acquired: Secondary | ICD-10-CM | POA: Diagnosis not present

## 2019-10-28 ENCOUNTER — Other Ambulatory Visit: Payer: Self-pay

## 2019-10-28 ENCOUNTER — Encounter: Payer: Self-pay | Admitting: Interventional Cardiology

## 2019-10-28 ENCOUNTER — Ambulatory Visit: Payer: Medicare PPO | Admitting: Interventional Cardiology

## 2019-10-28 VITALS — BP 142/78 | HR 62 | Ht 63.5 in | Wt 203.2 lb

## 2019-10-28 DIAGNOSIS — E119 Type 2 diabetes mellitus without complications: Secondary | ICD-10-CM | POA: Diagnosis not present

## 2019-10-28 DIAGNOSIS — I119 Hypertensive heart disease without heart failure: Secondary | ICD-10-CM | POA: Diagnosis not present

## 2019-10-28 DIAGNOSIS — I471 Supraventricular tachycardia: Secondary | ICD-10-CM | POA: Diagnosis not present

## 2019-10-28 DIAGNOSIS — E7800 Pure hypercholesterolemia, unspecified: Secondary | ICD-10-CM | POA: Diagnosis not present

## 2019-10-28 NOTE — Patient Instructions (Addendum)

## 2019-10-28 NOTE — Progress Notes (Signed)
Cardiology Office Note   Date:  10/28/2019   ID:  Betty Fox, DOB 1945-06-28, MRN [id number]  PCP:  Kathyrn Lass, MD    No chief complaint on file.    Wt Readings from Last 3 Encounters:  10/28/19 203 lb 3.2 oz (92.2 kg)  05/20/19 201 lb (91.2 kg)  05/01/19 203 lb 12.8 oz (92.4 kg)       History of Present Illness: Betty Fox is a 74 y.o. female  whohas a history of essential hypertension, hypothyroidism, dyslipidemia,and asthma.She was followed by Dr. Mare Ferrari in the past.About 10 years ago she had pericarditis. She had not had any recurrent symptoms of pericarditis. She has a history of allergy to ACE inhibitors which cause a cough. He's had a past history of supraventricular tachycardia which she can abort by doing a Valsalva maneuver. He has a history of hypothyroidism followed by endocrinology. She has been diagnosed as Hashimoto's thyroiditis. Dr. Buddy Duty is her endocrinologist. She has a history of mild diabetes. She does not have any history of ischemic heart disease.  In 2015,She lost 30 pounds. She was diagnosed with Sjogrens syndrome.  At the last visit in 2020, SVT was well controlled.  She had been on amlodipine in the past but this was changed her med to valsartan due to the possibility of gingival hyperplasia in 2/21.  Headaches got worse since that time.  She also had her COVID shot at the same time.  In early 2021, she has had some more headaches- starting when the BP med was changed.  She will be seeing Dr. Leta Baptist regarding the headaches.  She has migraines with associated visual changes.  Her shaking feeling has also gotten worse.  She had an MRI.  No significant issues per her report.    She had an episode of dizziness when she got up to answer the phone. She called EMS and was told she may have vertigo. She did not end up going to the hospital because all of her vital signs were normal.   She had a lot of stress from her husband's mental  health issues.   She has essential tremor.   Denies : Chest pain. Dizziness. Leg edema. Nitroglycerin use. Orthopnea.  Paroxysmal nocturnal dyspnea. Shortness of breath. Syncope.   Shortlived palpitations.  Nothing prolonged.        Past Medical History:  Diagnosis Date  . Allergic rhinitis   . Arthritis   . Asthma   . Balance problem   . Colitis    hx of  . Diverticulosis   . Dyspepsia   . Eczema   . Hashimoto's thyroiditis   . Headache   . Hyperlipidemia   . Hypertension   . Hypothyroidism   . Kidney stones   . Nephrolithiasis   . Osteopenia   . Pericarditis 1999  . Pneumonia   . PSVT (paroxysmal supraventricular tachycardia) ([place])   . Shingles   . Sjogren's syndrome ([place])   . Tremors of nervous system   . Uveitis   . Vitamin D deficiency     Past Surgical History:  Procedure Laterality Date  . BLADDER REPAIR    . BREAST REDUCTION SURGERY Bilateral   . LAPAROSCOPIC ENDOMETRIOSIS FULGURATION    . laparotomy with oopherectomy Left 1979  . MOUTH SURGERY  06/2014  . TUBAL LIGATION    . US ECHOCARDIOGRAPHY  07/27/2009   EF 55-60%     Current Outpatient Medications  Medication Sig Dispense Refill  .  amLODipine (NORVASC) 10 MG tablet Take 1 tablet (10 mg total) by mouth daily. 90 tablet 3  . ARNUITY ELLIPTA 100 MCG/ACT AEPB USE 1 INHALATION ONCE DAILY DURING SEASONS OF DIFFICULTY  3  . atenolol (TENORMIN) 25 MG tablet TAKE 1 TABLET BY MOUTH EVERY DAY 90 tablet 3  . atorvastatin (LIPITOR) 10 MG tablet Take 1 tablet (10 mg total) by mouth 5 days a week 30 tablet 11  . Beclomethasone Dipropionate (QVAR IN) Inhale 2 puffs into the lungs daily.     . Cholecalciferol (VITAMIN D) 2000 UNITS tablet Take 2,000 Units by mouth daily.    Marland Kitchen desoximetasone (TOPICORT) 0.25 % cream Apply 1 application topically as needed (for exzema).     . EPINEPHrine 0.3 mg/0.3 mL IJ SOAJ injection Inject 0.3 mg into the muscle once.    . fluticasone (FLONASE) 50 MCG/ACT nasal spray Place 2  sprays into both nostrils daily.  5  . hydrochlorothiazide (HYDRODIURIL) 25 MG tablet TAKE 1 TABLET BY MOUTH EVERY OTHER DAY OR AS DIRECTED. PLEASE CALL [phone] TO SET UP YEARLY FOLLOW UP FOR FURTHER REFILLS. 45 tablet 3  . levalbuterol (XOPENEX HFA) 45 MCG/ACT inhaler levalbuterol HFA 45 mcg/actuation aerosol inhaler  INHALE 1 PUFF EVERY 4 6 HRS AS NEEDED FOR COUGH, SHORTNESS OF BREATH, OR WHEEZING    . metFORMIN (GLUCOPHAGE) 500 MG tablet Take 500 mg by mouth daily with breakfast.    . potassium chloride (KLOR-CON) 10 MEQ tablet Take 1 tablet (10 mEq total) by mouth 2 (two) times daily. 180 tablet 2  . SYNTHROID 100 MCG tablet Take 1 tablet by mouth daily.     Current Facility-Administered Medications  Medication Dose Route Frequency Provider Last Rate Last Admin  . 0.9 %  sodium chloride infusion  500 mL Intravenous Once Nandigam, Kavitha V, MD        Allergies:   Ampicillin, Ace inhibitors, Calcium carbonate antacid, Prinivil [lisinopril], Esomeprazole magnesium, Omeprazole magnesium, Penicillins, Sulfa antibiotics, Sulfur, Zoledronic acid, and Ranitidine hcl    Social History:  The patient  reports that she has never smoked. She has never used smokeless tobacco. She reports current alcohol use. She reports that she does not use drugs.   Family History:  The patient's family history is not on file. She was adopted.    ROS:  Please see the history of present illness.   Otherwise, review of systems are positive for papitations.   All other systems are reviewed and negative.    PHYSICAL EXAM: VS:  BP (!) 142/78   Pulse 62   Ht 5' 3.5" (1.613 m)   Wt 203 lb 3.2 oz (92.2 kg)   SpO2 96%   BMI 35.43 kg/m  , BMI Body mass index is 35.43 kg/m. GEN: Well nourished, well developed, in no acute distress  HEENT: normal  Neck: no JVD, carotid bruits, or masses Cardiac: RRR; no murmurs, rubs, or gallops,no edema  Respiratory:  clear to auscultation bilaterally, normal work of  breathing GI: soft, nontender, nondistended, + BS MS: no deformity or atrophy  Skin: warm and dry, no rash Neuro:  Strength and sensation are intact Psych: euthymic mood, full affect    Recent Labs: 03/23/2019: BUN 8; Creatinine, Ser 0.82; Potassium 4.2; Sodium 139   Lipid Panel    Component Value Date/Time   CHOL 147 10/14/2017 1410   TRIG 125 10/14/2017 1410   HDL 48 10/14/2017 1410   CHOLHDL 3.1 10/14/2017 1410   LDLCALC 74 10/14/2017 1410  Other studies Reviewed: Additional studies/ records that were reviewed today with results demonstrating: .   ASSESSMENT AND PLAN:  1. SVT: Continue atenolol.  No prolonged sx. Can take extra atenolol 25 mg if she has prolonged palpitations.   2. DM: 7.6 A1C in 8/21; back on metformin 3. Hypertensive heart disease: The current medical regimen is effective;  continue present plan and medications. 4. Hyperlipidemia: adjusts frequency of lipitor based on how she is feeling. 5. Getting COVID booster shot soon.      Current medicines are reviewed at length with the patient today.  The patient concerns regarding her medicines were addressed.  The following changes have been made:  No change  Labs/ tests ordered today include:  No orders of the defined types were placed in this encounter.   Recommend 150 minutes/week of aerobic exercise Low fat, low carb, high fiber diet recommended  Disposition:   FU in 6 months   Signed, Larae Grooms, MD  10/28/2019 3:49 PM    [facility] [place], [place], [place]  [address] Phone: [phone]; Fax: [phone]

## 2019-11-23 ENCOUNTER — Ambulatory Visit: Payer: Medicare PPO | Admitting: Diagnostic Neuroimaging

## 2019-11-23 ENCOUNTER — Encounter: Payer: Self-pay | Admitting: Diagnostic Neuroimaging

## 2019-11-23 VITALS — BP 147/81 | HR 61 | Ht 64.2 in | Wt 203.0 lb

## 2019-11-23 DIAGNOSIS — G43109 Migraine with aura, not intractable, without status migrainosus: Secondary | ICD-10-CM | POA: Diagnosis not present

## 2019-11-23 DIAGNOSIS — G250 Essential tremor: Secondary | ICD-10-CM

## 2019-11-23 NOTE — Progress Notes (Signed)
[facility]  PATIENT: Betty Fox DOB: July 30, 1945  REFERRING CLINICIAN: Kathyrn Lass, MD HISTORY FROM: patient  REASON FOR VISIT: follow up   HISTORICAL  CHIEF COMPLAINT:  Chief Complaint  Patient presents with   Follow-up    no new c/o or changes   Room 6    alone    HISTORY OF PRESENT ILLNESS:   UPDATE (11/23/19, VRP): Since last visit, doing well. HA are improved. Tremor stable. No alleviating or aggravating factors. Has had some stress with husbands health.    UPDATE (05/20/19, VRP): Since last visit, was doing well until February 2021 when she started having increasing headaches.  She describes throbbing headaches with nausea, sensitive to light and sound, sinks colors and spots, sometimes losing vision.  Patient had some mild headaches when she was younger.  Family history of migraine in her daughter.  Patient has had some ringing in the ears.  Her tremor also is gradually worsened.  Patient had CT of the head which is unremarkable.  UPDATE 05/13/12: Since last visit patient had one episode of significant tremor while at church. Patient got up to go to the alter and began to have whole-body tremors. Patient did not lose consciousness. Symptoms last 45 minutes. She went downstairs had something to eat. Symptoms resolved on their own. Today patient feels well. No significant tremor. She has noted deterioration of her handwriting over time.  Regarding her MRI brain study, it shows a small possible pituitary macroadenoma. She's followed up with endocrinology for blood testing which apparently has been stable.  PRIOR HPI (12/28/11): 74 year old female with history of hypertension, Hashimoto's thyroiditis with hypothyroidism, asthma, idiopathic hyperprolactinemia, supraventricular tachycardia, here for evaluation of tremor.  Patient reports history of mild postural and action tremor of left upper extremity since 1999 when she was diagnosed with acute  pericarditis. Tremor has gradually progressed over that time. She also has intermittent head tremor. She does not notice much resting tremor. No balance or gait difficulties subjectively.   REVIEW OF SYSTEMS: Full 14 system review of systems performed and negative with exception of: as per HPI.  ALLERGIES: Allergies  Allergen Reactions   Ampicillin Hives and Swelling   Ace Inhibitors Cough   Calcium Carbonate Antacid Hives   Prinivil [Lisinopril] Cough   Esomeprazole Magnesium    Omeprazole Magnesium Hives   Penicillins Hives   Sulfa Antibiotics    Sulfur Itching   Zoledronic Acid     Osteonecrosis of jaw   Ranitidine Hcl Rash    HOME MEDICATIONS: Outpatient Medications Prior to Visit  Medication Sig Dispense Refill   amLODipine (NORVASC) 10 MG tablet Take 1 tablet (10 mg total) by mouth daily. 90 tablet 3   ARNUITY ELLIPTA 100 MCG/ACT AEPB USE 1 INHALATION ONCE DAILY DURING SEASONS OF DIFFICULTY  3   atenolol (TENORMIN) 25 MG tablet TAKE 1 TABLET BY MOUTH EVERY DAY 90 tablet 3   atorvastatin (LIPITOR) 10 MG tablet Take 1 tablet (10 mg total) by mouth 5 days a week 30 tablet 11   Beclomethasone Dipropionate (QVAR IN) Inhale 2 puffs into the lungs daily.      Cholecalciferol (VITAMIN D) 2000 UNITS tablet Take 2,000 Units by mouth daily.     desoximetasone (TOPICORT) 0.25 % cream Apply 1 application topically as needed (for exzema).      EPINEPHrine 0.3 mg/0.3 mL IJ SOAJ injection Inject 0.3 mg into the muscle once.     fluticasone (FLONASE) 50 MCG/ACT nasal spray Place 2 sprays  into both nostrils daily.  5   hydrochlorothiazide (HYDRODIURIL) 25 MG tablet TAKE 1 TABLET BY MOUTH EVERY OTHER DAY OR AS DIRECTED. PLEASE CALL [phone] TO SET UP YEARLY FOLLOW UP FOR FURTHER REFILLS. 45 tablet 3   levalbuterol (XOPENEX HFA) 45 MCG/ACT inhaler levalbuterol HFA 45 mcg/actuation aerosol inhaler  INHALE 1 PUFF EVERY 4 6 HRS AS NEEDED FOR COUGH, SHORTNESS OF BREATH, OR  WHEEZING     metFORMIN (GLUCOPHAGE) 500 MG tablet Take 500 mg by mouth daily with breakfast.     potassium chloride (KLOR-CON) 10 MEQ tablet Take 1 tablet (10 mEq total) by mouth 2 (two) times daily. 180 tablet 2   SYNTHROID 100 MCG tablet Take 1 tablet by mouth daily.     Facility-Administered Medications Prior to Visit  Medication Dose Route Frequency Provider Last Rate Last Admin   0.9 %  sodium chloride infusion  500 mL Intravenous Once Nandigam, Venia Minks, MD        PAST MEDICAL HISTORY: Past Medical History:  Diagnosis Date   Allergic rhinitis    Arthritis    Asthma    Balance problem    Colitis    hx of   Diverticulosis    Dyspepsia    Eczema    Hashimoto's thyroiditis    Headache    Hyperlipidemia    Hypertension    Hypothyroidism    Kidney stones    Nephrolithiasis    Osteopenia    Pericarditis 1999   Pneumonia    PSVT (paroxysmal supraventricular tachycardia) ([facility])    Shingles    Sjogren's syndrome ([facility])    Tremors of nervous system    Uveitis    Vitamin D deficiency     PAST SURGICAL HISTORY: Past Surgical History:  Procedure Laterality Date   BLADDER REPAIR     BREAST REDUCTION SURGERY Bilateral    LAPAROSCOPIC ENDOMETRIOSIS FULGURATION     laparotomy with oopherectomy Left 1979   MOUTH SURGERY  06/2014   TUBAL LIGATION     US ECHOCARDIOGRAPHY  07/27/2009   EF 55-60%    FAMILY HISTORY: Family History  Adopted: Yes  Problem Relation Age of Onset   Colon cancer Neg Hx    Rectal cancer Neg Hx     SOCIAL HISTORY: Social History   Socioeconomic History   Marital status: Married    Spouse name: Not on file   Number of children: 1   Years of education: grad school   Highest education level: Not on file  Occupational History   Occupation: retired Pharmacist, hospital  Tobacco Use   Smoking status: Never Smoker   Smokeless tobacco: Never Used  Scientific laboratory technician Use: Never used  Substance and Sexual  Activity   Alcohol use: Yes    Comment: 1 per year   Drug use: Never   Sexual activity: Not on file  Other Topics Concern   Not on file  Social History Narrative   Lives with husband   Caffeine- coffee 1 daily, occas tea   Right handed predominately   Social Determinants of Health   Financial Resource Strain:    Difficulty of Paying Living Expenses: Not on file  Food Insecurity:    Worried About Charity fundraiser in the Last Year: Not on file   [place] in the Last Year: Not on file  Transportation Needs:    Lack of Transportation (Medical): Not on file   Lack of Transportation (Non-Medical): Not on file  Physical  Activity:    Days of Exercise per Week: Not on file   Minutes of Exercise per Session: Not on file  Stress:    Feeling of Stress : Not on file  Social Connections:    Frequency of Communication with Friends and Family: Not on file   Frequency of Social Gatherings with Friends and Family: Not on file   Attends Religious Services: Not on file   Active Member of Clubs or Organizations: Not on file   Attends Archivist Meetings: Not on file   Marital Status: Not on file  Intimate Partner Violence:    Fear of Current or Ex-Partner: Not on file   Emotionally Abused: Not on file   Physically Abused: Not on file   Sexually Abused: Not on file     PHYSICAL EXAM  GENERAL EXAM/CONSTITUTIONAL: Vitals:  Vitals:   11/23/19 1404  BP: (!) 147/81  Pulse: 61  Weight: 203 lb (92.1 kg)  Height: 5' 4.2" (1.631 m)   Body mass index is 34.63 kg/m. Wt Readings from Last 3 Encounters:  11/23/19 203 lb (92.1 kg)  10/28/19 203 lb 3.2 oz (92.2 kg)  05/20/19 201 lb (91.2 kg)    Patient is in no distress; well developed, nourished and groomed; neck is supple  CARDIOVASCULAR:  Examination of carotid arteries is normal; no carotid bruits  Regular rate and rhythm, no murmurs  Examination of peripheral vascular system by  observation and palpation is normal  EYES:  Ophthalmoscopic exam of optic discs and posterior segments is normal; no papilledema or hemorrhages No exam data present  MUSCULOSKELETAL:  Gait, strength, tone, movements noted in Neurologic exam below  NEUROLOGIC: MENTAL STATUS:  No flowsheet data found.  awake, alert, oriented to person, place and time  recent and remote memory intact  normal attention and concentration  language fluent, comprehension intact, naming intact  fund of knowledge appropriate  CRANIAL NERVE:   2nd - no papilledema on fundoscopic exam  2nd, 3rd, 4th, 6th - pupils equal and reactive to light, visual fields full to confrontation, extraocular muscles intact, no nystagmus  5th - facial sensation symmetric  7th - facial strength symmetric  8th - hearing intact  9th - palate elevates symmetrically, uvula midline  11th - shoulder shrug symmetric  12th - tongue protrusion midline  MOTOR:   POSTURAL TREMOR IN BUE AND HEAD / MOUTH  normal bulk and tone, full strength in the BUE, BLE  SENSORY:   normal and symmetric to light touch, temperature, vibration  COORDINATION:   finger-nose-finger, fine finger movements normal  REFLEXES:   deep tendon reflexes TRACE and symmetric  GAIT/STATION:   ANTALGIC GAIT; SLOW AND CAUTIOUS     DIAGNOSTIC DATA (LABS, IMAGING, TESTING) - I reviewed patient records, labs, notes, testing and imaging myself where available.  Lab Results  Component Value Date   WBC 8.1 08/27/2012   HGB 14.9 08/27/2012   HCT 45.1 08/27/2012   MCV 95.9 08/27/2012   PLT 212.0 08/27/2012      Component Value Date/Time   NA 139 03/23/2019 1124   K 4.2 03/23/2019 1124   CL 99 03/23/2019 1124   CO2 25 03/23/2019 1124   GLUCOSE 376 (H) 03/23/2019 1124   GLUCOSE 123 (H) 03/18/2008 0700   BUN 8 03/23/2019 1124   CREATININE 0.82 03/23/2019 1124   CALCIUM 9.9 03/23/2019 1124   PROT 6.8 10/14/2017 1410   ALBUMIN 4.1  10/14/2017 1410   AST 24 10/14/2017 1410   ALT  31 10/14/2017 1410   ALKPHOS 89 10/14/2017 1410   BILITOT 0.6 10/14/2017 1410   GFRNONAA 70 03/23/2019 1124   GFRAA 81 03/23/2019 1124   Lab Results  Component Value Date   CHOL 147 10/14/2017   HDL 48 10/14/2017   LDLCALC 74 10/14/2017   TRIG 125 10/14/2017   CHOLHDL 3.1 10/14/2017   No results found for: HGBA1C No results found for: VITAMINB12 No results found for: TSH   04/13/19 CT head  - No acute intracranial abnormality.    ASSESSMENT AND PLAN  74 y.o. year old female here with:  Dx:  1. Migraine with aura and without status migrainosus, not intractable   2. Essential tremor      PLAN:  MIGRAINE WITH AURA PREVENTION  LIFESTYLE CHANGES -Stop or avoid smoking -Decrease or avoid caffeine / alcohol -Eat and sleep on a regular schedule -Exercise several times per week - consider topiramate 50mg  at bedtime; after 1-2 weeks increase to 50mg  twice a day; drink plenty of water - consider 2nd line: - erenumab (Aimovig) 70mg  monthly (may increase to 140mg  monthly) - fremanezumab (Ajovy) 225mg  monthly (or 675mg  every 3 months) - galazanezumab (Emgality) 240mg  loading dose; then 120mg  monthly  MIGRAINE WITH AURA RESCUE  - ibuprofen, tylenol as needed - consider rimegepant (Nurtec) 75mg  as needed for breakthrough headache; max 8 per month  ESSENTIAL TREMOR  - consider primidone 25mg  at bedtime; may increase up to 50mg  twice a day; for tremor  PITUITARY MICROADENOMA - follow up with endocrinology  Return for return to PCP, pending if symptoms worsen or fail to improve.    Penni Bombard, MD 22/02/9796, 9:21 PM Certified in Neurology, Neurophysiology and Neuroimaging  [facility] [address], [place], [place] [address] [phone]

## 2020-02-23 DIAGNOSIS — F439 Reaction to severe stress, unspecified: Secondary | ICD-10-CM | POA: Diagnosis not present

## 2020-02-23 DIAGNOSIS — E7800 Pure hypercholesterolemia, unspecified: Secondary | ICD-10-CM | POA: Diagnosis not present

## 2020-02-23 DIAGNOSIS — Z6835 Body mass index (BMI) 35.0-35.9, adult: Secondary | ICD-10-CM | POA: Diagnosis not present

## 2020-02-23 DIAGNOSIS — E1169 Type 2 diabetes mellitus with other specified complication: Secondary | ICD-10-CM | POA: Diagnosis not present

## 2020-03-17 DIAGNOSIS — J019 Acute sinusitis, unspecified: Secondary | ICD-10-CM | POA: Diagnosis not present

## 2020-04-05 DIAGNOSIS — M3500 Sicca syndrome, unspecified: Secondary | ICD-10-CM | POA: Diagnosis not present

## 2020-04-05 DIAGNOSIS — H209 Unspecified iridocyclitis: Secondary | ICD-10-CM | POA: Diagnosis not present

## 2020-04-05 DIAGNOSIS — M2550 Pain in unspecified joint: Secondary | ICD-10-CM | POA: Diagnosis not present

## 2020-04-05 DIAGNOSIS — E669 Obesity, unspecified: Secondary | ICD-10-CM | POA: Diagnosis not present

## 2020-04-05 DIAGNOSIS — R768 Other specified abnormal immunological findings in serum: Secondary | ICD-10-CM | POA: Diagnosis not present

## 2020-04-05 DIAGNOSIS — E063 Autoimmune thyroiditis: Secondary | ICD-10-CM | POA: Diagnosis not present

## 2020-04-05 DIAGNOSIS — Z6836 Body mass index (BMI) 36.0-36.9, adult: Secondary | ICD-10-CM | POA: Diagnosis not present

## 2020-04-21 DIAGNOSIS — K219 Gastro-esophageal reflux disease without esophagitis: Secondary | ICD-10-CM | POA: Diagnosis not present

## 2020-04-21 DIAGNOSIS — J3089 Other allergic rhinitis: Secondary | ICD-10-CM | POA: Diagnosis not present

## 2020-04-21 DIAGNOSIS — Z91011 Allergy to milk products: Secondary | ICD-10-CM | POA: Diagnosis not present

## 2020-04-21 DIAGNOSIS — J4530 Mild persistent asthma, uncomplicated: Secondary | ICD-10-CM | POA: Diagnosis not present

## 2020-04-27 ENCOUNTER — Other Ambulatory Visit: Payer: Self-pay | Admitting: Interventional Cardiology

## 2020-04-30 ENCOUNTER — Other Ambulatory Visit: Payer: Self-pay | Admitting: Interventional Cardiology

## 2020-05-05 DIAGNOSIS — H52203 Unspecified astigmatism, bilateral: Secondary | ICD-10-CM | POA: Diagnosis not present

## 2020-05-05 DIAGNOSIS — E119 Type 2 diabetes mellitus without complications: Secondary | ICD-10-CM | POA: Diagnosis not present

## 2020-05-05 DIAGNOSIS — Z961 Presence of intraocular lens: Secondary | ICD-10-CM | POA: Diagnosis not present

## 2020-05-05 DIAGNOSIS — H43813 Vitreous degeneration, bilateral: Secondary | ICD-10-CM | POA: Diagnosis not present

## 2020-05-24 NOTE — Progress Notes (Signed)
Cardiology Office Note   Date:  05/25/2020   ID:  Betty Fox, DOB 1945/01/29, MRN [id number]  PCP:  Kathyrn Lass, MD    No chief complaint on file.  SVT/HTN  Wt Readings from Last 3 Encounters:  05/25/20 198 lb 12.8 oz (90.2 kg)  11/23/19 203 lb (92.1 kg)  10/28/19 203 lb 3.2 oz (92.2 kg)       History of Present Illness: Betty Fox is a 75 y.o. female   whohas a history of essential hypertension, hypothyroidism, dyslipidemia,and asthma.She was followed by Dr. Mare Ferrari in the past.About 10 years ago she had pericarditis. She had not had any recurrent symptoms of pericarditis. She has a history of allergy to ACE inhibitors which cause a cough. He's had a past history of supraventricular tachycardia which she can abort by doing a Valsalva maneuver. He has a history of hypothyroidism followed by endocrinology. She has been diagnosed as Hashimoto's thyroiditis. Dr. Buddy Duty is her endocrinologist. She has a history of mild diabetes. She does not have any history of ischemic heart disease.  In 2015,She lost 30 pounds. She was diagnosed with Sjogrens syndrome.  In 2020, SVT was well controlled.  She had been on amlodipine in the past but this was changed her med to valsartan due to the possibility of gingival hyperplasia in 2/21. Headaches got worse since that time. She also had her COVID shot at the same time.  In early 2021, she has had some more headaches- starting when the BP med was changed.She will be seeing Dr. Leta Baptist regarding the headaches. She has migraines with associated visual changes. Her shaking feeling has also gotten worse. She had an MRI.  No significant issues per her report.    In 2021, She had an episode of dizziness when she got up to answer the phone. She called EMS and was told she may have vertigo. She did not end up going to the hospital because all of her vital signs were normal.   She had a lot of stress from her husband's mental  health issues.   She has essential tremor.   Her husband requires some assistance and this is a source of stress.  He had a stroke which has affected his memory.  She reports increased fatigue.  She has not been walking much since her foot surgery for heel spur in 2020.   Denies : Chest pain. Dizziness. Leg edema. Nitroglycerin use. Orthopnea.  Paroxysmal nocturnal dyspnea. Shortness of breath. Syncope.   Minimal palpitations.  Leg cramps- worse at night   Past Medical History:  Diagnosis Date  . Allergic rhinitis   . Arthritis   . Asthma   . Balance problem   . Colitis    hx of  . Diverticulosis   . Dyspepsia   . Eczema   . Hashimoto's thyroiditis   . Headache   . Hyperlipidemia   . Hypertension   . Hypothyroidism   . Kidney stones   . Nephrolithiasis   . Osteopenia   . Pericarditis 1999  . Pneumonia   . PSVT (paroxysmal supraventricular tachycardia) ([place])   . Shingles   . Sjogren's syndrome ([place])   . Tremors of nervous system   . Uveitis   . Vitamin D deficiency     Past Surgical History:  Procedure Laterality Date  . BLADDER REPAIR    . BREAST REDUCTION SURGERY Bilateral   . LAPAROSCOPIC ENDOMETRIOSIS FULGURATION    . laparotomy with oopherectomy Left 1979  .  MOUTH SURGERY  06/2014  . TUBAL LIGATION    . US ECHOCARDIOGRAPHY  07/27/2009   EF 55-60%     Current Outpatient Medications  Medication Sig Dispense Refill  . amLODipine (NORVASC) 10 MG tablet TAKE 1 TABLET BY MOUTH EVERY DAY 90 tablet 1  . ARNUITY ELLIPTA 100 MCG/ACT AEPB USE 1 INHALATION ONCE DAILY DURING SEASONS OF DIFFICULTY  3  . atenolol (TENORMIN) 25 MG tablet TAKE 1 TABLET BY MOUTH EVERY DAY 90 tablet 3  . atorvastatin (LIPITOR) 10 MG tablet Take 1 tablet (10 mg total) by mouth 5 days a week 30 tablet 11  . Beclomethasone Dipropionate (QVAR IN) Inhale 2 puffs into the lungs daily.    . Cholecalciferol (VITAMIN D) 2000 UNITS tablet Take 2,000 Units by mouth daily.    Marland Kitchen desoximetasone  (TOPICORT) 0.25 % cream Apply 1 application topically as needed (for exzema).    . EPINEPHrine 0.3 mg/0.3 mL IJ SOAJ injection Inject 0.3 mg into the muscle once.    . fluticasone (FLONASE) 50 MCG/ACT nasal spray Place 2 sprays into both nostrils daily.  5  . hydrochlorothiazide (HYDRODIURIL) 25 MG tablet TAKE 1 TABLET BY MOUTH EVERY OTHER DAY OR AS DIRECTED. PLEASE CALL [phone] TO SET UP YEARLY FOLLOW UP FOR FURTHER REFILLS. 45 tablet 3  . levalbuterol (XOPENEX HFA) 45 MCG/ACT inhaler levalbuterol HFA 45 mcg/actuation aerosol inhaler  INHALE 1 PUFF EVERY 4 6 HRS AS NEEDED FOR COUGH, SHORTNESS OF BREATH, OR WHEEZING    . metFORMIN (GLUCOPHAGE) 500 MG tablet Take 500 mg by mouth daily with breakfast.    . potassium chloride (KLOR-CON) 10 MEQ tablet TAKE 1 TABLET (10 MEQ TOTAL) BY MOUTH 2 (TWO) TIMES DAILY. 180 tablet 2  . SYNTHROID 100 MCG tablet Take 1 tablet by mouth daily.     Current Facility-Administered Medications  Medication Dose Route Frequency Provider Last Rate Last Admin  . 0.9 %  sodium chloride infusion  500 mL Intravenous Once Nandigam, Kavitha V, MD        Allergies:   Ampicillin, Ace inhibitors, Calcium carbonate antacid, Prinivil [lisinopril], Elemental sulfur, Esomeprazole magnesium, Omeprazole magnesium, Penicillins, Sulfa antibiotics, Zoledronic acid, and Ranitidine hcl    Social History:  The patient  reports that she has never smoked. She has never used smokeless tobacco. She reports current alcohol use. She reports that she does not use drugs.   Family History:  The patient's family history is not on file. She was adopted.    ROS:  Please see the history of present illness.   Otherwise, review of systems are positive for leg cramps.   All other systems are reviewed and negative.    PHYSICAL EXAM: VS:  BP (!) 142/70   Pulse (!) 54   Ht 5' 4.2" (1.631 m)   Wt 198 lb 12.8 oz (90.2 kg)   SpO2 93%   BMI 33.91 kg/m  , BMI Body mass index is 33.91 kg/m. GEN:  Well nourished, well developed, in no acute distress  HEENT: normal  Neck: no JVD, carotid bruits, or masses Cardiac: RRR; no murmurs, rubs, or gallops,no edema  Respiratory:  clear to auscultation bilaterally, normal work of breathing GI: soft, nontender, nondistended, + BS MS: no deformity or atrophy ; cyst on back of left calf Skin: warm and dry, no rash Neuro:  Strength and sensation are intact Psych: euthymic mood, full affect   EKG:   The ekg ordered today demonstrates sinus bradycardia, nonspecific ST changes   Recent Labs:  No results found for requested labs within last 8760 hours.   Lipid Panel    Component Value Date/Time   CHOL 147 10/14/2017 1410   TRIG 125 10/14/2017 1410   HDL 48 10/14/2017 1410   CHOLHDL 3.1 10/14/2017 1410   LDLCALC 74 10/14/2017 1410     Other studies Reviewed: Additional studies/ records that were reviewed today with results demonstrating: Prior labs reviewed.   ASSESSMENT AND PLAN:  1. SVT: Sx controlled. The current medical regimen is effective;  continue present plan and medications. 2. Diabetes: A1C 7.8.  Not taking metformin anymore.  Just stopped.  Needs to f/u with PMD.  She had GI side effects.  Repeat A1C.  Having increased thirst.   3. Hypertensive heart disease: The current medical regimen is effective;  continue present plan and medications.  Check BP at home.  She has cut back on her HCTZ frequency as well.  No increasing edema.  She was concerned that cramps are being caused by the HCTZ.  We will check potassium today.  If blood pressure is high, may need to consider trying ARB at home given her diabetes.  She had a cough with ACE inhibitor. 4. Hyperlipidemia: LDL 108 in 2021.  Tolerating atorvastatin only 3 days a week. Check lipids as she decreased frequency of statin.  5. Increase exercise to target below.  This will help stamina and blood sugar.     Current medicines are reviewed at length with the patient today.  The  patient concerns regarding her medicines were addressed.  The following changes have been made:  No change  Labs/ tests ordered today include:  No orders of the defined types were placed in this encounter.   Recommend 150 minutes/week of aerobic exercise Low fat, low carb, high fiber diet recommended  Disposition:   FU in 1 year   Signed, Larae Grooms, MD  05/25/2020 9:44 AM    [facility] [address], [place], [place]  [address] Phone: [phone]; Fax: [phone]

## 2020-05-25 ENCOUNTER — Other Ambulatory Visit: Payer: Self-pay

## 2020-05-25 ENCOUNTER — Ambulatory Visit: Payer: Medicare PPO | Admitting: Interventional Cardiology

## 2020-05-25 ENCOUNTER — Encounter: Payer: Self-pay | Admitting: Interventional Cardiology

## 2020-05-25 VITALS — BP 142/70 | HR 54 | Ht 64.2 in | Wt 198.8 lb

## 2020-05-25 DIAGNOSIS — E119 Type 2 diabetes mellitus without complications: Secondary | ICD-10-CM | POA: Diagnosis not present

## 2020-05-25 DIAGNOSIS — I119 Hypertensive heart disease without heart failure: Secondary | ICD-10-CM | POA: Diagnosis not present

## 2020-05-25 DIAGNOSIS — E7800 Pure hypercholesterolemia, unspecified: Secondary | ICD-10-CM | POA: Diagnosis not present

## 2020-05-25 DIAGNOSIS — I471 Supraventricular tachycardia: Secondary | ICD-10-CM | POA: Diagnosis not present

## 2020-05-25 NOTE — Patient Instructions (Signed)
Medication Instructions:  Your physician recommends that you continue on your current medications as directed. Please refer to the Current Medication list given to you today.  *If you need a refill on your cardiac medications before your next appointment, please call your pharmacy*   Lab Work: Lab work to be done today--CMET, Lipids, TSH, A1C If you have labs (blood work) drawn today and your tests are completely normal, you will receive your results only by: Marland Kitchen MyChart Message (if you have MyChart) OR . A paper copy in the mail If you have any lab test that is abnormal or we need to change your treatment, we will call you to review the results.   Testing/Procedures: none   Follow-Up: At [facility], you and your health needs are our priority.  As part of our continuing mission to provide you with exceptional heart care, we have created designated Provider Care Teams.  These Care Teams include your primary Cardiologist (physician) and Advanced Practice Providers (APPs -  Physician Assistants and Nurse Practitioners) who all work together to provide you with the care you need, when you need it.  We recommend signing up for the patient portal called "MyChart".  Sign up information is provided on this After Visit Summary.  MyChart is used to connect with patients for Virtual Visits (Telemedicine).  Patients are able to view lab/test results, encounter notes, upcoming appointments, etc.  Non-urgent messages can be sent to your provider as well.   To learn more about what you can do with MyChart, go to [email or web address].    Your next appointment:   6 month(s)  The format for your next appointment:   In Person  Provider:   You may see Larae Grooms, MD or one of the following Advanced Practice Providers on your designated Care Team:    Melina Copa, PA-C  Ermalinda Barrios, PA-C    Other Instructions

## 2020-05-26 LAB — COMPREHENSIVE METABOLIC PANEL
ALT: 42 IU/L — ABNORMAL HIGH (ref 0–32)
AST: 28 IU/L (ref 0–40)
Albumin/Globulin Ratio: 1.6 (ref 1.2–2.2)
Albumin: 4.3 g/dL (ref 3.7–4.7)
Alkaline Phosphatase: 128 IU/L — ABNORMAL HIGH (ref 44–121)
BUN/Creatinine Ratio: 12 (ref 12–28)
BUN: 10 mg/dL (ref 8–27)
Bilirubin Total: 0.7 mg/dL (ref 0.0–1.2)
CO2: 25 mmol/L (ref 20–29)
Calcium: 10 mg/dL (ref 8.7–10.3)
Chloride: 99 mmol/L (ref 96–106)
Creatinine, Ser: 0.81 mg/dL (ref 0.57–1.00)
Globulin, Total: 2.7 g/dL (ref 1.5–4.5)
Glucose: 329 mg/dL — ABNORMAL HIGH (ref 65–99)
Potassium: 4 mmol/L (ref 3.5–5.2)
Sodium: 139 mmol/L (ref 134–144)
Total Protein: 7 g/dL (ref 6.0–8.5)
eGFR: 75 mL/min/{1.73_m2} (ref >59)

## 2020-05-26 LAB — HEMOGLOBIN A1C
Est. average glucose Bld gHb Est-mCnc: 275 mg/dL
Hgb A1c MFr Bld: 11.2 % — ABNORMAL HIGH (ref 4.8–5.6)

## 2020-05-26 LAB — LIPID PANEL
Chol/HDL Ratio: 4.1 ratio (ref 0.0–4.4)
Cholesterol, Total: 186 mg/dL (ref 100–199)
HDL: 45 mg/dL (ref >39)
LDL Chol Calc (NIH): 122 mg/dL — ABNORMAL HIGH (ref 0–99)
Triglycerides: 103 mg/dL (ref 0–149)
VLDL Cholesterol Cal: 19 mg/dL (ref 5–40)

## 2020-05-26 LAB — TSH: TSH: 1.68 u[IU]/mL (ref 0.450–4.500)

## 2020-05-30 ENCOUNTER — Telehealth: Payer: Self-pay | Admitting: Interventional Cardiology

## 2020-05-30 NOTE — Telephone Encounter (Signed)
Betty Booze, MD  05/27/2020 3:08 PM EDT      A1C markedly elevated. Needs f/u with PMD. LDL above target. Want to see LDL< 100 due to DM. I would like her to go back to atorvastatin 5 days/week.    I spoke with patient and reviewed results with her.  She had been off atorvastatin for about 2 weeks due to being on an antibiotic and restarted about a week prior to seeing Dr Irish Lack.  Previously she had decreased atorvastatin to 3 days a week due to leg cramping.  Leg cramping improved only slightly after decreasing dose.  She will go back to taking Atorvastatin 5 days a week and let us know if problems with cramping or leg pain.  Patient will follow up with PCP regarding elevated glucose and A1C.  Lab results faxed to Dr Sabra Heck.

## 2020-05-30 NOTE — Telephone Encounter (Signed)
?  Pt is returning call to get result ?

## 2020-07-01 ENCOUNTER — Other Ambulatory Visit: Payer: Self-pay | Admitting: Interventional Cardiology

## 2020-07-11 DIAGNOSIS — E039 Hypothyroidism, unspecified: Secondary | ICD-10-CM | POA: Diagnosis not present

## 2020-07-11 DIAGNOSIS — E1165 Type 2 diabetes mellitus with hyperglycemia: Secondary | ICD-10-CM | POA: Diagnosis not present

## 2020-07-11 DIAGNOSIS — E063 Autoimmune thyroiditis: Secondary | ICD-10-CM | POA: Diagnosis not present

## 2020-07-25 DIAGNOSIS — E039 Hypothyroidism, unspecified: Secondary | ICD-10-CM | POA: Diagnosis not present

## 2020-07-25 DIAGNOSIS — E063 Autoimmune thyroiditis: Secondary | ICD-10-CM | POA: Diagnosis not present

## 2020-07-25 DIAGNOSIS — E1165 Type 2 diabetes mellitus with hyperglycemia: Secondary | ICD-10-CM | POA: Diagnosis not present

## 2020-08-24 ENCOUNTER — Ambulatory Visit
Admission: EM | Admit: 2020-08-24 | Discharge: 2020-08-24 | Disposition: A | Discharge status: Home / Self Care | Payer: Medicare PPO | Attending: Emergency Medicine | Admitting: Emergency Medicine

## 2020-08-24 ENCOUNTER — Encounter: Payer: Self-pay | Admitting: Emergency Medicine

## 2020-08-24 ENCOUNTER — Other Ambulatory Visit: Payer: Self-pay

## 2020-08-24 DIAGNOSIS — J019 Acute sinusitis, unspecified: Secondary | ICD-10-CM

## 2020-08-24 DIAGNOSIS — R059 Cough, unspecified: Secondary | ICD-10-CM

## 2020-08-24 LAB — POCT RAPID STREP A (OFFICE): Rapid Strep A Screen: NEGATIVE

## 2020-08-24 MED ORDER — BENZONATATE 200 MG PO CAPS: 200.0000 mg | ORAL_CAPSULE | Freq: Three times a day (TID) | ORAL | 0 refills | Status: AC | PRN

## 2020-08-24 MED ORDER — DOXYCYCLINE HYCLATE 100 MG PO CAPS: 100.0000 mg | ORAL_CAPSULE | Freq: Two times a day (BID) | ORAL | 0 refills | Status: AC

## 2020-08-24 NOTE — ED Provider Notes (Signed)
[facility]    CSN: [id number] Arrival date & time: 08/24/20  [id number]      History   Chief Complaint Chief Complaint  Patient presents with   Cough   Sore Throat    HPI Betty Fox is a 75 y.o. female history of asthma, DM presenting today for evaluation of URI symptoms.  Reports cough and nasal congestion for over 1 week.  Reports sinus congestion.  Mucus has been been tinged with blood of recently.  Eyes have felt irritated and watery.  Reports at home COVID tests negative.  Using Mucinex, Benadryl and inhaler without for relief.    HPI  Past Medical History:  Diagnosis Date   Allergic rhinitis    Arthritis    Asthma    Balance problem    Colitis    hx of   Diverticulosis    Dyspepsia    Eczema    Hashimoto's thyroiditis    Headache    Hyperlipidemia    Hypertension    Hypothyroidism    Kidney stones    Nephrolithiasis    Osteopenia    Pericarditis 1999   Pneumonia    PSVT (paroxysmal supraventricular tachycardia) ([place])    Shingles    Sjogren's syndrome ([place])    Tremors of nervous system    Uveitis    Vitamin D deficiency     Patient Active Problem List   Diagnosis Date Noted   SVT (supraventricular tachycardia) ([place]) 06/03/2015   Hematochezia 11/17/2012   Chest pain at rest 08/27/2012   Paresthesias in left hand 08/27/2012   Diabetes mellitus ([place]) 05/19/2012   Essential tremor 05/13/2012   Palpitations 11/19/2011   Benign hypertensive heart disease without heart failure 10/19/2010   Pure hypercholesterolemia 10/19/2010   Hypothyroid 10/19/2010   Asthma 10/19/2010    Past Surgical History:  Procedure Laterality Date   BLADDER REPAIR     BREAST REDUCTION SURGERY Bilateral    LAPAROSCOPIC ENDOMETRIOSIS FULGURATION     laparotomy with oopherectomy Left 1979   MOUTH SURGERY  06/2014   TUBAL LIGATION     US ECHOCARDIOGRAPHY  07/27/2009   EF 55-60%    OB History   No obstetric history on file.      Home Medications    Prior to  Admission medications   Medication Sig Start Date End Date Taking? Authorizing Provider  amLODipine (NORVASC) 10 MG tablet TAKE 1 TABLET BY MOUTH EVERY DAY 05/02/20  Yes Jettie Booze, MD  ARNUITY ELLIPTA 100 MCG/ACT AEPB USE 1 INHALATION ONCE DAILY DURING SEASONS OF DIFFICULTY 04/07/15  Yes [provider]  atenolol (TENORMIN) 25 MG tablet TAKE 1 TABLET BY MOUTH EVERY DAY 07/01/20  Yes Jettie Booze, MD  atorvastatin (LIPITOR) 10 MG tablet Take 1 tablet (10 mg total) by mouth 5 days a week 07/11/17  Yes Jettie Booze, MD  Beclomethasone Dipropionate (QVAR IN) Inhale 2 puffs into the lungs daily.   Yes [provider]  benzonatate (TESSALON) 200 MG capsule Take 1 capsule (200 mg total) by mouth 3 (three) times daily as needed for up to 7 days for cough. 08/24/20 08/31/20 Yes Keyry Iracheta C, PA-C  Cholecalciferol (VITAMIN D) 2000 UNITS tablet Take 2,000 Units by mouth daily.   Yes [provider]  doxycycline (VIBRAMYCIN) 100 MG capsule Take 1 capsule (100 mg total) by mouth 2 (two) times daily for 7 days. 08/24/20 08/31/20 Yes Kailia Starry C, PA-C  hydrochlorothiazide (HYDRODIURIL) 25 MG tablet TAKE 1 TABLET  BY MOUTH EVERY OTHER DAY OR AS DIRECTED. PLEASE CALL [phone] TO SET UP YEARLY FOLLOW UP FOR FURTHER REFILLS. 06/30/19  Yes Jettie Booze, MD  potassium chloride (KLOR-CON) 10 MEQ tablet TAKE 1 TABLET (10 MEQ TOTAL) BY MOUTH 2 (TWO) TIMES DAILY. 04/27/20  Yes Jettie Booze, MD  SYNTHROID 100 MCG tablet Take 1 tablet by mouth daily. 10/07/19  Yes [provider]  TRULICITY [id number] SOPN Inject into the skin. 08/21/20  Yes [provider]  desoximetasone (TOPICORT) 0.25 % cream Apply 1 application topically as needed (for exzema).    [provider]  EPINEPHrine 0.3 mg/0.3 mL IJ SOAJ injection Inject 0.3 mg into the muscle once.    [provider]  fluticasone (FLONASE) 50 MCG/ACT nasal spray Place 2  sprays into both nostrils daily. 05/18/16   [provider]  levalbuterol (XOPENEX HFA) 45 MCG/ACT inhaler levalbuterol HFA 45 mcg/actuation aerosol inhaler  INHALE 1 PUFF EVERY 4 6 HRS AS NEEDED FOR COUGH, SHORTNESS OF BREATH, OR WHEEZING    [provider]    Family History Family History  Adopted: Yes  Problem Relation Age of Onset   Colon cancer Neg Hx    Rectal cancer Neg Hx     Social History Social History   Tobacco Use   Smoking status: Never   Smokeless tobacco: Never  Vaping Use   Vaping Use: Never used  Substance Use Topics   Alcohol use: Yes    Comment: 1 per year   Drug use: Never     Allergies   Ampicillin, Ace inhibitors, Calcium carbonate antacid, Prinivil [lisinopril], Elemental sulfur, Esomeprazole magnesium, Omeprazole magnesium, Penicillins, Sulfa antibiotics, Zoledronic acid, and Ranitidine hcl   Review of Systems Review of Systems  Constitutional:  Negative for activity change, appetite change, chills, fatigue and fever.  HENT:  Positive for congestion, rhinorrhea, sinus pressure and sore throat. Negative for ear pain and trouble swallowing.   Eyes:  Negative for discharge and redness.  Respiratory:  Positive for cough. Negative for chest tightness and shortness of breath.   Cardiovascular:  Negative for chest pain.  Gastrointestinal:  Negative for abdominal pain, diarrhea, nausea and vomiting.  Musculoskeletal:  Negative for myalgias.  Skin:  Negative for rash.  Neurological:  Negative for dizziness, light-headedness and headaches.    Physical Exam Triage Vital Signs ED Triage Vitals  Enc Vitals Group     BP      Pulse      Resp      Temp      Temp src      SpO2      Weight      Height      Head Circumference      Peak Flow      Pain Score      Pain Loc      Pain Edu?      Excl. in [place]?    No data found.  Updated Vital Signs BP 111/73 (BP Location: Left Arm)   Pulse 75   Temp 99.6 F (37.6 C) (Oral)   Resp 17    SpO2 93%   Visual Acuity Right Eye Distance:   Left Eye Distance:   Bilateral Distance:    Right Eye Near:   Left Eye Near:    Bilateral Near:     Physical Exam Vitals and nursing note reviewed.  Constitutional:      Appearance: She is well-developed.     Comments: No acute  distress  HENT:     Head: Normocephalic and atraumatic.     Ears:     Comments: Bilateral ears without tenderness to palpation of external auricle, tragus and mastoid, EAC's without erythema or swelling, TM's with good bony landmarks and cone of light. Non erythematous.      Nose: Nose normal.     Mouth/Throat:     Comments: Oral mucosa pink and moist, no tonsillar enlargement or exudate. Posterior pharynx patent and nonerythematous, no uvula deviation or swelling. Normal phonation.  Eyes:     Conjunctiva/sclera: Conjunctivae normal.  Cardiovascular:     Rate and Rhythm: Normal rate and regular rhythm.  Pulmonary:     Effort: Pulmonary effort is normal. No respiratory distress.     Comments: Breathing comfortably at rest, CTABL, no wheezing, rales or other adventitious sounds auscultated  O2 rechecked and was 94% Abdominal:     General: There is no distension.  Musculoskeletal:        General: Normal range of motion.     Cervical back: Neck supple.  Skin:    General: Skin is warm and dry.  Neurological:     Mental Status: She is alert and oriented to person, place, and time.     UC Treatments / Results  Labs (all labs ordered are listed, but only abnormal results are displayed) Labs Reviewed  CULTURE, GROUP A STREP [facility])  POCT RAPID STREP A (OFFICE)    EKG   Radiology No results found.  Procedures Procedures (including critical care time)  Medications Ordered in UC Medications - No data to display  Initial Impression / Assessment and Plan / UC Course  I have reviewed the triage vital signs and the nursing notes.  Pertinent labs & imaging results that were available during  my care of the patient were reviewed by me and considered in my medical decision making (see chart for details).     Treating for sinusitis-covering with doxycycline, continue symptomatic and supportive care of cough and congestion, lungs clear to auscultation, continue to monitor breathing and symptoms with addition of doxycycline.  Advised patient to follow-up if symptoms persisting or worsening. Discussed strict return precautions. Patient verbalized understanding and is agreeable with plan.  Final Clinical Impressions(s) / UC Diagnoses   Final diagnoses:  Acute sinusitis with symptoms > 10 days  Cough     Discharge Instructions      Begin doxycycline twice daily x1 week Continue Mucinex twice daily Tessalon/benzonatate every 8 hours for cough Continue daily cetirizine/Zyrtec or loratadine/Claritin to help with throat irritation/postnasal drainage Tylenol as needed Follow-up if not improving or worsening     ED Prescriptions     Medication Sig Dispense Auth. Provider   doxycycline (VIBRAMYCIN) 100 MG capsule Take 1 capsule (100 mg total) by mouth 2 (two) times daily for 7 days. 14 capsule Doninique Lwin C, PA-C   benzonatate (TESSALON) 200 MG capsule Take 1 capsule (200 mg total) by mouth 3 (three) times daily as needed for up to 7 days for cough. 28 capsule Mae Cianci, [place] C, PA-C      PDMP not reviewed this encounter.   Janith Lima, PA-C 08/24/20 1053

## 2020-08-24 NOTE — ED Triage Notes (Signed)
Patient c/o productive cough w/ " yellow" sputum and sore throat x "over a week".  Patient denies fever.   Patient endorses eye irritation and night sweats.   Patient endorses having nasal drainage w/ " bloody clots, yellow, and little tiny dark clots".   Patient reports doing 2 at home COVID test with negative results.   Patient has used inhaler, Benadryl, and Mucinex w/ no relief of symptoms.   History of Asthma and DM.

## 2020-08-24 NOTE — Discharge Instructions (Addendum)
Begin doxycycline twice daily x1 week Continue Mucinex twice daily Tessalon/benzonatate every 8 hours for cough Continue daily cetirizine/Zyrtec or loratadine/Claritin to help with throat irritation/postnasal drainage Tylenol as needed Follow-up if not improving or worsening

## 2020-08-25 DIAGNOSIS — Z0000 Encounter for general adult medical examination without abnormal findings: Secondary | ICD-10-CM | POA: Diagnosis not present

## 2020-08-25 DIAGNOSIS — Z1389 Encounter for screening for other disorder: Secondary | ICD-10-CM | POA: Diagnosis not present

## 2020-08-26 DIAGNOSIS — E1165 Type 2 diabetes mellitus with hyperglycemia: Secondary | ICD-10-CM | POA: Diagnosis not present

## 2020-08-26 DIAGNOSIS — E063 Autoimmune thyroiditis: Secondary | ICD-10-CM | POA: Diagnosis not present

## 2020-08-26 DIAGNOSIS — E039 Hypothyroidism, unspecified: Secondary | ICD-10-CM | POA: Diagnosis not present

## 2020-08-26 LAB — CULTURE, GROUP A STREP (THRC)

## 2020-09-12 DIAGNOSIS — E7800 Pure hypercholesterolemia, unspecified: Secondary | ICD-10-CM | POA: Diagnosis not present

## 2020-09-12 DIAGNOSIS — E669 Obesity, unspecified: Secondary | ICD-10-CM | POA: Diagnosis not present

## 2020-09-26 DIAGNOSIS — E785 Hyperlipidemia, unspecified: Secondary | ICD-10-CM | POA: Diagnosis not present

## 2020-09-26 DIAGNOSIS — E039 Hypothyroidism, unspecified: Secondary | ICD-10-CM | POA: Diagnosis not present

## 2020-09-26 DIAGNOSIS — K219 Gastro-esophageal reflux disease without esophagitis: Secondary | ICD-10-CM | POA: Diagnosis not present

## 2020-09-26 DIAGNOSIS — M3500 Sicca syndrome, unspecified: Secondary | ICD-10-CM | POA: Diagnosis not present

## 2020-09-26 DIAGNOSIS — E119 Type 2 diabetes mellitus without complications: Secondary | ICD-10-CM | POA: Diagnosis not present

## 2020-09-26 DIAGNOSIS — I10 Essential (primary) hypertension: Secondary | ICD-10-CM | POA: Diagnosis not present

## 2020-09-26 DIAGNOSIS — J45909 Unspecified asthma, uncomplicated: Secondary | ICD-10-CM | POA: Diagnosis not present

## 2020-09-26 DIAGNOSIS — E669 Obesity, unspecified: Secondary | ICD-10-CM | POA: Diagnosis not present

## 2020-09-26 DIAGNOSIS — R32 Unspecified urinary incontinence: Secondary | ICD-10-CM | POA: Diagnosis not present

## 2020-11-02 ENCOUNTER — Other Ambulatory Visit: Payer: Self-pay | Admitting: Interventional Cardiology

## 2020-11-18 ENCOUNTER — Other Ambulatory Visit: Payer: Self-pay | Admitting: Interventional Cardiology

## 2020-12-01 DIAGNOSIS — E1165 Type 2 diabetes mellitus with hyperglycemia: Secondary | ICD-10-CM | POA: Diagnosis not present

## 2020-12-01 DIAGNOSIS — E039 Hypothyroidism, unspecified: Secondary | ICD-10-CM | POA: Diagnosis not present

## 2020-12-01 DIAGNOSIS — Z7984 Long term (current) use of oral hypoglycemic drugs: Secondary | ICD-10-CM | POA: Diagnosis not present

## 2020-12-01 DIAGNOSIS — E063 Autoimmune thyroiditis: Secondary | ICD-10-CM | POA: Diagnosis not present

## 2020-12-01 DIAGNOSIS — D352 Benign neoplasm of pituitary gland: Secondary | ICD-10-CM | POA: Diagnosis not present

## 2020-12-02 ENCOUNTER — Other Ambulatory Visit: Payer: Self-pay | Admitting: Internal Medicine

## 2020-12-14 DIAGNOSIS — N3946 Mixed incontinence: Secondary | ICD-10-CM | POA: Diagnosis not present

## 2020-12-14 DIAGNOSIS — N281 Cyst of kidney, acquired: Secondary | ICD-10-CM | POA: Diagnosis not present

## 2020-12-15 ENCOUNTER — Other Ambulatory Visit: Payer: Self-pay | Admitting: Internal Medicine

## 2020-12-15 DIAGNOSIS — D352 Benign neoplasm of pituitary gland: Secondary | ICD-10-CM

## 2020-12-22 DIAGNOSIS — J3089 Other allergic rhinitis: Secondary | ICD-10-CM | POA: Diagnosis not present

## 2020-12-22 DIAGNOSIS — Z91011 Allergy to milk products: Secondary | ICD-10-CM | POA: Diagnosis not present

## 2020-12-22 DIAGNOSIS — J4530 Mild persistent asthma, uncomplicated: Secondary | ICD-10-CM | POA: Diagnosis not present

## 2020-12-22 DIAGNOSIS — K219 Gastro-esophageal reflux disease without esophagitis: Secondary | ICD-10-CM | POA: Diagnosis not present

## 2021-01-05 ENCOUNTER — Ambulatory Visit
Admission: RE | Admit: 2021-01-05 | Discharge: 2021-01-05 | Disposition: A | Discharge status: Home / Self Care | Payer: Medicare PPO | Source: Ambulatory Visit | Attending: Internal Medicine | Admitting: Internal Medicine

## 2021-01-05 ENCOUNTER — Other Ambulatory Visit: Payer: Self-pay

## 2021-01-05 DIAGNOSIS — D352 Benign neoplasm of pituitary gland: Secondary | ICD-10-CM | POA: Diagnosis not present

## 2021-01-05 MED ORDER — GADOBENATE DIMEGLUMINE 529 MG/ML IV SOLN
7.0000 mL | Freq: Once | INTRAVENOUS | Status: AC | PRN
  Administered 2021-01-05: 14:00:00 7 mL via INTRAVENOUS

## 2021-02-07 DIAGNOSIS — Z20822 Contact with and (suspected) exposure to covid-19: Secondary | ICD-10-CM | POA: Diagnosis not present

## 2021-02-13 ENCOUNTER — Other Ambulatory Visit: Payer: Self-pay | Admitting: Family Medicine

## 2021-02-13 ENCOUNTER — Ambulatory Visit
Admission: RE | Admit: 2021-02-13 | Discharge: 2021-02-13 | Disposition: A | Discharge status: Home / Self Care | Payer: Medicare PPO | Source: Ambulatory Visit | Attending: Family Medicine | Admitting: Family Medicine

## 2021-02-13 ENCOUNTER — Other Ambulatory Visit: Payer: Self-pay

## 2021-02-13 DIAGNOSIS — R059 Cough, unspecified: Secondary | ICD-10-CM

## 2021-02-13 DIAGNOSIS — R062 Wheezing: Secondary | ICD-10-CM | POA: Diagnosis not present

## 2021-02-13 DIAGNOSIS — J4541 Moderate persistent asthma with (acute) exacerbation: Secondary | ICD-10-CM | POA: Diagnosis not present

## 2021-02-16 DIAGNOSIS — R062 Wheezing: Secondary | ICD-10-CM | POA: Diagnosis not present

## 2021-02-16 DIAGNOSIS — R059 Cough, unspecified: Secondary | ICD-10-CM | POA: Diagnosis not present

## 2021-02-16 DIAGNOSIS — J4541 Moderate persistent asthma with (acute) exacerbation: Secondary | ICD-10-CM | POA: Diagnosis not present

## 2021-02-28 DIAGNOSIS — R053 Chronic cough: Secondary | ICD-10-CM | POA: Diagnosis not present

## 2021-02-28 DIAGNOSIS — R0981 Nasal congestion: Secondary | ICD-10-CM | POA: Diagnosis not present

## 2021-02-28 DIAGNOSIS — R062 Wheezing: Secondary | ICD-10-CM | POA: Diagnosis not present

## 2021-03-03 DIAGNOSIS — Z8639 Personal history of other endocrine, nutritional and metabolic disease: Secondary | ICD-10-CM | POA: Diagnosis not present

## 2021-03-03 DIAGNOSIS — E119 Type 2 diabetes mellitus without complications: Secondary | ICD-10-CM | POA: Diagnosis not present

## 2021-03-03 DIAGNOSIS — E063 Autoimmune thyroiditis: Secondary | ICD-10-CM | POA: Diagnosis not present

## 2021-03-03 DIAGNOSIS — E039 Hypothyroidism, unspecified: Secondary | ICD-10-CM | POA: Diagnosis not present

## 2021-04-05 DIAGNOSIS — E669 Obesity, unspecified: Secondary | ICD-10-CM | POA: Diagnosis not present

## 2021-04-05 DIAGNOSIS — H209 Unspecified iridocyclitis: Secondary | ICD-10-CM | POA: Diagnosis not present

## 2021-04-05 DIAGNOSIS — Z6831 Body mass index (BMI) 31.0-31.9, adult: Secondary | ICD-10-CM | POA: Diagnosis not present

## 2021-04-05 DIAGNOSIS — M3500 Sicca syndrome, unspecified: Secondary | ICD-10-CM | POA: Diagnosis not present

## 2021-04-05 DIAGNOSIS — M1711 Unilateral primary osteoarthritis, right knee: Secondary | ICD-10-CM | POA: Diagnosis not present

## 2021-04-05 DIAGNOSIS — R768 Other specified abnormal immunological findings in serum: Secondary | ICD-10-CM | POA: Diagnosis not present

## 2021-04-13 ENCOUNTER — Emergency Department (HOSPITAL_COMMUNITY): Payer: Medicare PPO

## 2021-04-13 ENCOUNTER — Emergency Department (HOSPITAL_COMMUNITY)
Admission: EM | Admit: 2021-04-13 | Discharge: 2021-04-13 | Disposition: A | Discharge status: Home / Self Care | Payer: Medicare PPO | Attending: Emergency Medicine | Admitting: Emergency Medicine

## 2021-04-13 DIAGNOSIS — Z79899 Other long term (current) drug therapy: Secondary | ICD-10-CM | POA: Insufficient documentation

## 2021-04-13 DIAGNOSIS — R0789 Other chest pain: Secondary | ICD-10-CM | POA: Insufficient documentation

## 2021-04-13 DIAGNOSIS — R079 Chest pain, unspecified: Secondary | ICD-10-CM

## 2021-04-13 DIAGNOSIS — J45909 Unspecified asthma, uncomplicated: Secondary | ICD-10-CM | POA: Diagnosis not present

## 2021-04-13 DIAGNOSIS — R110 Nausea: Secondary | ICD-10-CM | POA: Insufficient documentation

## 2021-04-13 DIAGNOSIS — M25511 Pain in right shoulder: Secondary | ICD-10-CM | POA: Insufficient documentation

## 2021-04-13 DIAGNOSIS — R7309 Other abnormal glucose: Secondary | ICD-10-CM | POA: Diagnosis not present

## 2021-04-13 DIAGNOSIS — E039 Hypothyroidism, unspecified: Secondary | ICD-10-CM | POA: Insufficient documentation

## 2021-04-13 DIAGNOSIS — M549 Dorsalgia, unspecified: Secondary | ICD-10-CM | POA: Diagnosis not present

## 2021-04-13 DIAGNOSIS — I10 Essential (primary) hypertension: Secondary | ICD-10-CM | POA: Diagnosis not present

## 2021-04-13 DIAGNOSIS — Z7951 Long term (current) use of inhaled steroids: Secondary | ICD-10-CM | POA: Insufficient documentation

## 2021-04-13 LAB — CBC
HCT: 44.8 % (ref 36.0–46.0)
Hemoglobin: 14.8 g/dL (ref 12.0–15.0)
MCH: 31 pg (ref 26.0–34.0)
MCHC: 33 g/dL (ref 30.0–36.0)
MCV: 93.9 fL (ref 80.0–100.0)
Platelets: 235 10*3/uL (ref 150–400)
RBC: 4.77 MIL/uL (ref 3.87–5.11)
RDW: 12.6 % (ref 11.5–15.5)
WBC: 9 10*3/uL (ref 4.0–10.5)
nRBC: 0 % (ref 0.0–0.2)

## 2021-04-13 LAB — BASIC METABOLIC PANEL
Anion gap: 6 (ref 5–15)
BUN: 9 mg/dL (ref 8–23)
CO2: 29 mmol/L (ref 22–32)
Calcium: 10.2 mg/dL (ref 8.9–10.3)
Chloride: 106 mmol/L (ref 98–111)
Creatinine, Ser: 0.68 mg/dL (ref 0.44–1.00)
GFR, Estimated: >60 mL/min (ref >60)
Glucose, Bld: 108 mg/dL — ABNORMAL HIGH (ref 70–99)
Potassium: 4.3 mmol/L (ref 3.5–5.1)
Sodium: 141 mmol/L (ref 135–145)

## 2021-04-13 LAB — TROPONIN I (HIGH SENSITIVITY)
Troponin I (High Sensitivity): 6 ng/L (ref <18)
Troponin I (High Sensitivity): 7 ng/L (ref <18)

## 2021-04-13 MED ORDER — CYCLOBENZAPRINE HCL 10 MG PO TABS
5.0000 mg | ORAL_TABLET | Freq: Once | ORAL | Status: AC
  Administered 2021-04-13: 5 mg via ORAL
  Filled 2021-04-13: qty 1

## 2021-04-13 MED ORDER — LIDOCAINE 5 % EX PTCH
1.0000 | MEDICATED_PATCH | CUTANEOUS | Status: DC
  Administered 2021-04-13: 1 via TRANSDERMAL
  Filled 2021-04-13: qty 1

## 2021-04-13 NOTE — ED Provider Triage Note (Signed)
Emergency Medicine Provider Triage Evaluation Note ? ?Betty Fox , a 77 y.o. female  was evaluated in triage.  Pt complains of chest pain x 2 days. Hx of GERD and pericarditis. Sx worse when laying down.  ?Given 324 mg ASA by EMS, reported discomfort improved. Tried tylenol earlier today without relief.  ? ?Review of Systems  ?Positive: Chest pain  ?Negative: Fever, cough ? ?Physical Exam  ?BP 136/64   Pulse (!) 58   Temp 98.6 ?F (37 ?C) (Oral)   Resp 16   SpO2 99%  ?Gen:   Awake, no distress   ?Resp:  Normal effort  ?MSK:   Moves extremities without difficulty  ?Other:   ? ?Medical Decision Making  ?Medically screening exam initiated at 6:34 PM.  Appropriate orders placed.  Betty Fox was informed that the remainder of the evaluation will be completed by another provider, this initial triage assessment does not replace that evaluation, and the importance of remaining in the ED until their evaluation is complete. ? ?  ?Kateri Plummer, PA-C ?04/13/21 1834 ? ?

## 2021-04-13 NOTE — ED Provider Notes (Signed)
?[facility] ?Provider Note ? ? ?CSN: [id number] ?Arrival date & time: 04/13/21  1811 ? ?  ? ?History ? ?Chief Complaint  ?Patient presents with  ? Chest Pain  ? ? ?Betty Fox is a 76 y.o. female presenting for right sided back pain that radiates into her right neck and down her right arm and sometimes at its worst radiates into her chest.  Patient reports when she lays down or puts pressure on the back area it makes the pain worse.  She does endorse nausea as well.  When patient pushes on her chest it also makes the pain worse.  She denies any shortness of breath.  She denies any congestion, runny nose.  She denies any abdominal pain, headaches, fevers, or chills.  EMS gave her 324 of aspirin prior to arrival. ? ? ?Chest Pain ?Associated symptoms: back pain   ? ?  ? ?Home Medications ?Prior to Admission medications   ?Medication Sig Start Date End Date Taking? Authorizing Provider  ?amLODipine (NORVASC) 10 MG tablet TAKE 1 TABLET BY MOUTH EVERY DAY 11/02/20   Jettie Booze, MD  ?ARNUITY ELLIPTA 100 MCG/ACT AEPB USE 1 INHALATION ONCE DAILY DURING SEASONS OF DIFFICULTY 04/07/15   [provider]  ?atenolol (TENORMIN) 25 MG tablet TAKE 1 TABLET BY MOUTH EVERY DAY 07/01/20   Jettie Booze, MD  ?atorvastatin (LIPITOR) 10 MG tablet Take 1 tablet (10 mg total) by mouth 5 days a week 07/11/17   Jettie Booze, MD  ?Beclomethasone Dipropionate (QVAR IN) Inhale 2 puffs into the lungs daily.    [provider]  ?Cholecalciferol (VITAMIN D) 2000 UNITS tablet Take 2,000 Units by mouth daily.    [provider]  ?desoximetasone (TOPICORT) 0.25 % cream Apply 1 application topically as needed (for exzema).    [provider]  ?EPINEPHrine 0.3 mg/0.3 mL IJ SOAJ injection Inject 0.3 mg into the muscle once.    [provider]  ?fluticasone (FLONASE) 50 MCG/ACT nasal spray Place 2 sprays into both nostrils daily. 05/18/16   [provider]  ?hydrochlorothiazide (HYDRODIURIL) 25 MG tablet TAKE 1 TABLET BY MOUTH EVERY OTHER DAY AS DIRECTED.CALL [phone] FOR FOLLOW UP APT 11/18/20   Jettie Booze, MD  ?levalbuterol [facility] HFA) 45 MCG/ACT inhaler levalbuterol HFA 45 mcg/actuation aerosol inhaler ? INHALE 1 PUFF EVERY 4 6 HRS AS NEEDED FOR COUGH, SHORTNESS OF BREATH, OR WHEEZING    [provider]  ?potassium chloride (KLOR-CON) 10 MEQ tablet TAKE 1 TABLET BY MOUTH 2 TIMES DAILY. 11/18/20   Jettie Booze, MD  ?SYNTHROID 100 MCG tablet Take 1 tablet by mouth daily. 10/07/19   [provider]  ?TRULICITY 2.58 NI/7.7OE SOPN Inject into the skin. 08/21/20   [provider]  ?   ? ?Allergies    ?Ampicillin, Ace inhibitors, Calcium carbonate antacid, Prinivil [lisinopril], Elemental sulfur, Esomeprazole magnesium, Omeprazole magnesium, Penicillins, Sulfa antibiotics, Zoledronic acid, and Ranitidine hcl   ? ?Review of Systems   ?Review of Systems  ?Cardiovascular:  Positive for chest pain.  ?Musculoskeletal:  Positive for back pain and myalgias.  ? ?Physical Exam ?Updated Vital Signs ?BP 136/64   Pulse (!) 58   Temp 98.6 ?F (37 ?C) (Oral)   Resp 16   SpO2 99%  ?Physical Exam ?Vitals and nursing note reviewed.  ?Constitutional:   ?   General: She is not in acute distress. ?   Appearance: She is well-developed.  ?HENT:  ?  Head: Normocephalic and atraumatic.  ?Eyes:  ?   Conjunctiva/sclera: Conjunctivae normal.  ?Cardiovascular:  ?   Rate and Rhythm: Normal rate and regular rhythm.  ?   Heart sounds: No murmur heard. ?Pulmonary:  ?   Effort: Pulmonary effort is normal. No respiratory distress.  ?   Breath sounds: Normal breath sounds.  ?Chest:  ?   Chest wall: Tenderness present. No deformity.  ?Abdominal:  ?   Palpations: Abdomen is soft.  ?   Tenderness: There is no abdominal tenderness.  ?Musculoskeletal:     ?   General: No swelling.  ?   Cervical back: Neck supple.  ?   Comments: TTP of right lower  scapula  ?Skin: ?   General: Skin is warm and dry.  ?   Capillary Refill: Capillary refill takes less than 2 seconds.  ?Neurological:  ?   Mental Status: She is alert.  ?Psychiatric:     ?   Mood and Affect: Mood normal.  ? ? ?ED Results / Procedures / Treatments   ?Labs ?(all labs ordered are listed, but only abnormal results are displayed) ?Labs Reviewed  ?BASIC METABOLIC PANEL - Abnormal; Notable for the following components:  ?    Result Value  ? Glucose, Bld 108 (*)   ? All other components within normal limits  ?CBC  ?TROPONIN I (HIGH SENSITIVITY)  ?TROPONIN I (HIGH SENSITIVITY)  ? ? ?EKG ?EKG Interpretation ? ?Date/Time:  Thursday April 13 2021 18:18:33 EDT ?Ventricular Rate:  63 ?PR Interval:  168 ?QRS Duration: 80 ?QT Interval:  402 ?QTC Calculation: 411 ?R Axis:   79 ?Text Interpretation: Normal sinus rhythm Nonspecific T wave abnormality Abnormal ECG When compared with ECG of 18-May-2006 17:02, PREVIOUS ECG IS PRESENT Confirmed by Lavenia Atlas [phone]) on 04/13/2021 8:55:53 PM ? ?Radiology ?DG Chest 2 View ? ?Result Date: 04/13/2021 ?CLINICAL DATA:  Right-sided chest pain x2 days. EXAM: CHEST - 2 VIEW COMPARISON:  February 13, 2021 FINDINGS: The heart size and mediastinal contours are within normal limits. There is mild calcification of the aortic arch. The lungs are mildly hyperinflated with a trace amount of stable bibasilar linear scarring and/or atelectasis noted. Both lungs are otherwise clear. Multilevel degenerative changes are seen throughout the thoracic spine. IMPRESSION: Stable exam without acute or active cardiopulmonary disease. Electronically Signed   By: Virgina Norfolk M.D.   On: 04/13/2021 19:07   ? ?Procedures ?Procedures  ? ? ?Medications Ordered in ED ?Medications  ?lidocaine (LIDODERM) 5 % 1 patch (1 patch Transdermal Patch Applied 04/13/21 2109)  ?cyclobenzaprine (FLEXERIL) tablet 5 mg (5 mg Oral Given 04/13/21 2109)  ? ? ?ED Course/ Medical Decision Making/ A&P ?  ?                         ?Medical Decision Making ?Amount and/or Complexity of Data Reviewed ?Labs: ordered. ?Radiology: ordered. ? ?Risk ?Prescription drug management. ? ? ? ?53 yoF PMHx per chart review reveals essential hypertension, hypothyroidism, dyslipidemia, and asthma, pericarditis (10 years ago), Sojourn's, hypothyroidism, SVT which was aborted by Valsalva, Hashimoto's thyroiditis presenting for right-sided back pain that radiates into her neck and into her chest and into her arm.  Additional history obtained from husband and daughter at bedside. ? ?Labs overall reassuring.  Mildly elevated glucose at 108.  Troponin within normal limits at 6.  Chest x-ray my review shows no focal findings and appears similar to prior which is confirmed by radiology.  Patient's repeat  troponin 7.  Patient desires to be managed outpatient and follow-up with her primary care provider.  She reports that the pain is feeling better.  She has follow-up scheduled with her cardiologist and plans to follow-up with her primary care provider soon.  Strict return precautions were given.  Patient, daughter, and husband verbalized agreement understanding with plan. ? ? ?Patient seen in conjunction with Dr. Dina Rich.  ?Final Clinical Impression(s) / ED Diagnoses ?Final diagnoses:  ?Chest pain, unspecified type  ? ? ?Rx / DC Orders ?ED Discharge Orders   ? ? None  ? ?  ? ? ?  ?Lupita Dawn, MD ?04/14/21 0010 ? ?  ?Lorelle Gibbs, DO ?04/14/21 2205 ? ?

## 2021-04-13 NOTE — Discharge Instructions (Addendum)
You were evaluated in the Emergency Department and after careful evaluation, we did not find any emergent condition requiring admission or further testing in the hospital. ? ?Your exam/testing today was overall reassuring. ? ?Please return to the Emergency Department if you experience any worsening of your condition.  Thank you for allowing [place] to be a part of your care.  Close follow-up with your primary care provider and follow-up as scheduled with cardiologist. ?

## 2021-04-13 NOTE — ED Triage Notes (Signed)
Patient BIB [facility] from an urgent care for right sided chest pain that started one day ago and is exacerbated by laying supine. Patient is alert, oriented, and in no apparent distress at this time. Patient received '324mg'$  ASA PTA, patient reports improvement in pain ten minute after receiving aspirin.  ? ?EMS vitals ?BP 144/86 ?HR 68 ?SpO2 100% ?CBG 112 ?

## 2021-04-19 DIAGNOSIS — M546 Pain in thoracic spine: Secondary | ICD-10-CM | POA: Diagnosis not present

## 2021-05-01 ENCOUNTER — Other Ambulatory Visit: Payer: Self-pay | Admitting: Interventional Cardiology

## 2021-05-08 DIAGNOSIS — M5412 Radiculopathy, cervical region: Secondary | ICD-10-CM | POA: Diagnosis not present

## 2021-05-15 DIAGNOSIS — M5412 Radiculopathy, cervical region: Secondary | ICD-10-CM | POA: Diagnosis not present

## 2021-05-16 DIAGNOSIS — Z961 Presence of intraocular lens: Secondary | ICD-10-CM | POA: Diagnosis not present

## 2021-05-16 DIAGNOSIS — E119 Type 2 diabetes mellitus without complications: Secondary | ICD-10-CM | POA: Diagnosis not present

## 2021-05-16 DIAGNOSIS — H5203 Hypermetropia, bilateral: Secondary | ICD-10-CM | POA: Diagnosis not present

## 2021-05-19 DIAGNOSIS — M5412 Radiculopathy, cervical region: Secondary | ICD-10-CM | POA: Diagnosis not present

## 2021-05-22 DIAGNOSIS — M5412 Radiculopathy, cervical region: Secondary | ICD-10-CM | POA: Diagnosis not present

## 2021-05-22 NOTE — Progress Notes (Signed)
?  ?Cardiology Office Note ? ? ?Date:  05/23/2021  ? ?ID:  Betty Fox, DOB 06-14-1945, MRN [id number] ? ?PCP:  Kathyrn Lass, MD  ? ? ?No chief complaint on file. ? ?SVT/HTN ? ?Wt Readings from Last 3 Encounters:  ?05/23/21 181 lb (82.1 kg)  ?04/13/21 198 lb 13.7 oz (90.2 kg)  ?05/25/20 198 lb 12.8 oz (90.2 kg)  ?  ? ?  ?History of Present Illness: ?Betty Fox is a 76 y.o. female    who has a history of essential hypertension, hypothyroidism, dyslipidemia,and asthma. She was followed by Dr. Mare Ferrari in the past.  About 10 years ago she had pericarditis. She had not had any recurrent symptoms of pericarditis. She has a history of allergy to ACE inhibitors which cause a cough. He's had a past history of supraventricular tachycardia which she can abort by doing a Valsalva maneuver. He has a history of hypothyroidism followed by endocrinology.  She has been diagnosed as Hashimoto's thyroiditis.  Dr. Buddy Duty is her endocrinologist.  She has a history of mild diabetes. She does not have any history of ischemic heart disease.   ?In 2015, She lost 30 pounds.  She was diagnosed with Sjogrens syndrome.  ?  ?In 2020, SVT was well controlled. ?  ?She had been on amlodipine in the past but this was changed her med to valsartan due to the possibility of gingival hyperplasia in 2/21.  Headaches got worse since that time.  She also had her COVID shot at the same time. ?  ?In early 2021, she has had some more headaches- starting when the BP med was changed.  She will be seeing Dr. Leta Baptist regarding the headaches.  She has migraines with associated visual changes.  Her shaking feeling has also gotten worse.  She had an MRI.  No significant issues per her report.   ?  ?In 2021, She had an episode of dizziness when she got up to answer the phone. She called EMS and was told she may have vertigo. She did not end up going to the hospital because all of her vital signs were normal.  ?  ?She had a lot of stress from her husband's mental  health issues.  ?  ?She has essential tremor.  ?  ?Her husband requires some assistance and this is a source of stress.  He had a stroke which has affected his memory. ?  ?She reports increased fatigue.  She has not been walking much since her foot surgery for heel spur in 2020.  ? ?In 2023, She has had some neck pain from a bone spur.  She was taking prednisone.  Did have mild palpitations with the prednisone.  ? ?Denies : Chest pain. Dizziness. Leg edema. Nitroglycerin use. Orthopnea.  Paroxysmal nocturnal dyspnea. Shortness of breath. Syncope.   ? ? ? ?Past Medical History:  ?Diagnosis Date  ? Allergic rhinitis   ? Arthritis   ? Asthma   ? Balance problem   ? Colitis   ? hx of  ? Diverticulosis   ? Dyspepsia   ? Eczema   ? Hashimoto's thyroiditis   ? Headache   ? Hyperlipidemia   ? Hypertension   ? Hypothyroidism   ? Kidney stones   ? Nephrolithiasis   ? Osteopenia   ? Pericarditis 1999  ? Pneumonia   ? PSVT (paroxysmal supraventricular tachycardia) ([place])   ? Shingles   ? Sjogren's syndrome ([place])   ? Tremors of nervous system   ?  Uveitis   ? Vitamin D deficiency   ? ? ?Past Surgical History:  ?Procedure Laterality Date  ? BLADDER REPAIR    ? BREAST REDUCTION SURGERY Bilateral   ? LAPAROSCOPIC ENDOMETRIOSIS FULGURATION    ? laparotomy with oopherectomy Left 1979  ? MOUTH SURGERY  06/2014  ? TUBAL LIGATION    ? US ECHOCARDIOGRAPHY  07/27/2009  ? EF 55-60%  ? ? ? ?Current Outpatient Medications  ?Medication Sig Dispense Refill  ? amLODipine (NORVASC) 10 MG tablet TAKE 1 TABLET BY MOUTH EVERY DAY 90 tablet 0  ? ARNUITY ELLIPTA 100 MCG/ACT AEPB USE 1 INHALATION ONCE DAILY DURING SEASONS OF DIFFICULTY  3  ? atenolol (TENORMIN) 25 MG tablet TAKE 1 TABLET BY MOUTH EVERY DAY 90 tablet 3  ? atorvastatin (LIPITOR) 10 MG tablet Take 1 tablet (10 mg total) by mouth 5 days a week 30 tablet 11  ? Beclomethasone Dipropionate (QVAR IN) Inhale 2 puffs into the lungs daily.    ? Cholecalciferol (VITAMIN D) 2000 UNITS tablet Take  2,000 Units by mouth daily.    ? desoximetasone (TOPICORT) 0.25 % cream Apply 1 application topically as needed (for exzema).    ? EPINEPHrine 0.3 mg/0.3 mL IJ SOAJ injection Inject 0.3 mg into the muscle once.    ? fluticasone (FLONASE) 50 MCG/ACT nasal spray Place 2 sprays into both nostrils daily.  5  ? hydrochlorothiazide (HYDRODIURIL) 25 MG tablet TAKE 1 TABLET BY MOUTH EVERY OTHER DAY AS DIRECTED.CALL [phone] FOR FOLLOW UP APT 45 tablet 3  ? levalbuterol (XOPENEX HFA) 45 MCG/ACT inhaler levalbuterol HFA 45 mcg/actuation aerosol inhaler ? INHALE 1 PUFF EVERY 4 6 HRS AS NEEDED FOR COUGH, SHORTNESS OF BREATH, OR WHEEZING    ? potassium chloride (KLOR-CON) 10 MEQ tablet TAKE 1 TABLET BY MOUTH 2 TIMES DAILY. 180 tablet 2  ? SYNTHROID 100 MCG tablet Take 1 tablet by mouth daily.    ? TRULICITY 3.14 HF/0.2OV SOPN Inject into the skin.    ? ?Current Facility-Administered Medications  ?Medication Dose Route Frequency Provider Last Rate Last Admin  ? 0.9 %  sodium chloride infusion  500 mL Intravenous Once Nandigam, Venia Minks, MD      ? ? ?Allergies:   Ampicillin, Ace inhibitors, Calcium carbonate antacid, Prinivil [lisinopril], Elemental sulfur, Esomeprazole magnesium, Omeprazole magnesium, Penicillins, Sulfa antibiotics, Zoledronic acid, and Ranitidine hcl  ? ? ?Social History:  The patient  reports that she has never smoked. She has never used smokeless tobacco. She reports current alcohol use. She reports that she does not use drugs.  ? ?Family History:  The patient's family history is not on file. She was adopted.  ? ? ?ROS:  Please see the history of present illness.   Otherwise, review of systems are positive for neck pain.   All other systems are reviewed and negative.  ? ? ?PHYSICAL EXAM: ?VS:  BP 132/72   Pulse (!) 0   Ht 5' 3.5" (1.613 m)   Wt 181 lb (82.1 kg)   SpO2 95%   BMI 31.56 kg/m?  , BMI Body mass index is 31.56 kg/m?. ?GEN: Well nourished, well developed, in no acute distress ?HEENT:  normal ?Neck: no JVD, carotid bruits, or masses ?Cardiac: RRR; no murmurs, rubs, or gallops,no edema  ?Respiratory:  clear to auscultation bilaterally, normal work of breathing ?GI: soft, nontender, nondistended, + BS ?MS: no deformity or atrophy ?Skin: warm and dry, no rash ?Neuro:  Strength and sensation are intact ?Psych: euthymic mood, full affect ? ? ?  EKG:   ?The ekg ordered today demonstrates NSR, no ST changes ? ? ?Recent Labs: ?05/25/2020: ALT 42; TSH 1.680 ?04/13/2021: BUN 9; Creatinine, Ser 0.68; Hemoglobin 14.8; Platelets 235; Potassium 4.3; Sodium 141  ? ?Lipid Panel ?   ?Component Value Date/Time  ? CHOL 186 05/25/2020 1015  ? TRIG 103 05/25/2020 1015  ? HDL 45 05/25/2020 1015  ? CHOLHDL 4.1 05/25/2020 1015  ? LDLCALC 122 (H) 05/25/2020 1015  ? ?  ?Other studies Reviewed: ?Additional studies/ records that were reviewed today with results demonstrating: labs reviewed; hospital records reviewed. ? ? ?ASSESSMENT AND PLAN: ? ?SVT: Sx controlled. Continue atenolol.  ?DM: Whole food, plant based diet.  A1c 6.1. ?Hypertensive heart disease:  Low salt diet. The current medical regimen is effective;  continue present plan and medications. ?Hyperlipidemia: LDL 106 HDL 23 triglycerides 102 total cholesterol 148 in August 2022.  Tolerating 5 days a week atorvastatin.   ?Neck pain and bone spur in neck are biggest issue right now.  ? ? ?Current medicines are reviewed at length with the patient today.  The patient concerns regarding her medicines were addressed. ? ?The following changes have been made:  No change ? ?Labs/ tests ordered today include:  ?No orders of the defined types were placed in this encounter. ? ? ?Recommend 150 minutes/week of aerobic exercise ?Low fat, low carb, high fiber diet recommended ? ?Disposition:   FU in 1 year ? ? ?Signed, ?Larae Grooms, MD  ?05/23/2021 11:51 AM    ?[facility] ?[address], [place], [place]  [address] ?Phone: [phone]; Fax: [phone]   ? ?

## 2021-05-23 ENCOUNTER — Encounter: Payer: Self-pay | Admitting: Interventional Cardiology

## 2021-05-23 ENCOUNTER — Ambulatory Visit: Payer: Medicare PPO | Admitting: Interventional Cardiology

## 2021-05-23 VITALS — BP 132/72 | HR 0 | Ht 63.5 in | Wt 181.0 lb

## 2021-05-23 DIAGNOSIS — M542 Cervicalgia: Secondary | ICD-10-CM | POA: Diagnosis not present

## 2021-05-23 DIAGNOSIS — E119 Type 2 diabetes mellitus without complications: Secondary | ICD-10-CM

## 2021-05-23 DIAGNOSIS — E7800 Pure hypercholesterolemia, unspecified: Secondary | ICD-10-CM

## 2021-05-23 DIAGNOSIS — I471 Supraventricular tachycardia: Secondary | ICD-10-CM

## 2021-05-23 DIAGNOSIS — I119 Hypertensive heart disease without heart failure: Secondary | ICD-10-CM

## 2021-05-23 NOTE — Patient Instructions (Signed)

## 2021-05-24 DIAGNOSIS — M5412 Radiculopathy, cervical region: Secondary | ICD-10-CM | POA: Diagnosis not present

## 2021-05-29 DIAGNOSIS — M5412 Radiculopathy, cervical region: Secondary | ICD-10-CM | POA: Diagnosis not present

## 2021-05-31 DIAGNOSIS — M5412 Radiculopathy, cervical region: Secondary | ICD-10-CM | POA: Diagnosis not present

## 2021-06-05 DIAGNOSIS — M5412 Radiculopathy, cervical region: Secondary | ICD-10-CM | POA: Diagnosis not present

## 2021-06-08 DIAGNOSIS — M5412 Radiculopathy, cervical region: Secondary | ICD-10-CM | POA: Diagnosis not present

## 2021-06-14 DIAGNOSIS — M542 Cervicalgia: Secondary | ICD-10-CM | POA: Diagnosis not present

## 2021-06-17 ENCOUNTER — Other Ambulatory Visit: Payer: Self-pay | Admitting: Interventional Cardiology

## 2021-06-20 DIAGNOSIS — M5412 Radiculopathy, cervical region: Secondary | ICD-10-CM | POA: Diagnosis not present

## 2021-06-27 DIAGNOSIS — M5412 Radiculopathy, cervical region: Secondary | ICD-10-CM | POA: Diagnosis not present

## 2021-07-20 DIAGNOSIS — M4802 Spinal stenosis, cervical region: Secondary | ICD-10-CM | POA: Diagnosis not present

## 2021-07-25 ENCOUNTER — Other Ambulatory Visit: Payer: Self-pay | Admitting: Interventional Cardiology

## 2021-07-25 DIAGNOSIS — E669 Obesity, unspecified: Secondary | ICD-10-CM | POA: Diagnosis not present

## 2021-07-25 DIAGNOSIS — Z6832 Body mass index (BMI) 32.0-32.9, adult: Secondary | ICD-10-CM | POA: Diagnosis not present

## 2021-07-25 DIAGNOSIS — E1169 Type 2 diabetes mellitus with other specified complication: Secondary | ICD-10-CM

## 2021-07-25 DIAGNOSIS — J45909 Unspecified asthma, uncomplicated: Secondary | ICD-10-CM | POA: Diagnosis not present

## 2021-07-25 DIAGNOSIS — M4722 Other spondylosis with radiculopathy, cervical region: Secondary | ICD-10-CM | POA: Diagnosis not present

## 2021-07-25 MED ORDER — ATORVASTATIN CALCIUM 10 MG PO TABS: ORAL_TABLET | ORAL | 10 refills | Status: DC

## 2021-07-27 ENCOUNTER — Telehealth: Payer: Self-pay | Admitting: Interventional Cardiology

## 2021-07-27 ENCOUNTER — Telehealth: Payer: Self-pay | Admitting: *Deleted

## 2021-07-27 MED ORDER — ATORVASTATIN CALCIUM 10 MG PO TABS: ORAL_TABLET | ORAL | 3 refills | Status: DC

## 2021-07-27 NOTE — Telephone Encounter (Signed)
*  STAT* If patient is at the pharmacy, call can be transferred to refill team.   1. Which medications need to be refilled? (please list name of each medication and dose if known)  new prescription for Atorvastatin  2. Which pharmacy/location (including street and city if local pharmacy) is medication to be sent to? [facility] [place], [place]  Need a 30 day or 90 day supply? 90 days and refills

## 2021-07-27 NOTE — Telephone Encounter (Signed)
   Pre-operative Risk Assessment    Patient Name: Betty Fox  DOB: [birth year] MRN: [id number]      Request for Surgical Clearance    Procedure:   C3-C7 LAMINOPLASTY   Date of Surgery:  Clearance TBD                                 Surgeon:  DR. Ivan Croft Surgeon's Group or Practice Name:  [facility]  Phone number:  [phone] Fax number:  [phone] OR [phone]   Type of Clearance Requested:   - Medical , NO MEDICATIONS ARE LISTED AS NEEDING TO BE HELD   Type of Anesthesia:  General    Additional requests/questions:    Jiles Prows   07/27/2021, 4:03 PM

## 2021-07-27 NOTE — Telephone Encounter (Signed)
   Name: Betty Fox  DOB: 14-Feb-1945  MRN: [id number]  Primary Cardiologist: Larae Grooms, MD  Chart reviewed as part of pre-operative protocol coverage. Because of Betty Fox's past medical history and time since last visit, she will require a follow-up tele visit in order to better assess preoperative cardiovascular risk.  Pre-op covering staff: - Please schedule appointment and call patient to inform them. If patient already had an upcoming appointment within acceptable timeframe, please add "pre-op clearance" to the appointment notes so provider is aware. - Please contact requesting surgeon's office via preferred method (i.e, phone, fax) to inform them of need for appointment prior to surgery.  No medications are requesting to be held.  Elgie Collard, PA-C  07/27/2021, 4:24 PM

## 2021-07-27 NOTE — Telephone Encounter (Signed)
Pt's medication was sent to pt's pharmacy as requested. Confirmation received.  °

## 2021-07-28 ENCOUNTER — Telehealth: Payer: Self-pay | Admitting: *Deleted

## 2021-07-28 NOTE — Telephone Encounter (Signed)
Pt agreeable to plan of care for tele visit pre op 08/07/21 @ 10:20. Med rec and consent are done.    Patient Consent for Virtual Visit        Betty Fox has provided verbal consent on 07/28/2021 for a virtual visit (video or telephone).   CONSENT FOR VIRTUAL VISIT FOR:  Betty Fox  By participating in this virtual visit I agree to the following:  I hereby voluntarily request, consent and authorize [facility] and its employed or contracted physicians, physician assistants, nurse practitioners or other licensed health care professionals (the Practitioner), to provide me with telemedicine health care services (the "Services") as deemed necessary by the treating Practitioner. I acknowledge and consent to receive the Services by the Practitioner via telemedicine. I understand that the telemedicine visit will involve communicating with the Practitioner through live audiovisual communication technology and the disclosure of certain medical information by electronic transmission. I acknowledge that I have been given the opportunity to request an in-person assessment or other available alternative prior to the telemedicine visit and am voluntarily participating in the telemedicine visit.  I understand that I have the right to withhold or withdraw my consent to the use of telemedicine in the course of my care at any time, without affecting my right to future care or treatment, and that the Practitioner or I may terminate the telemedicine visit at any time. I understand that I have the right to inspect all information obtained and/or recorded in the course of the telemedicine visit and may receive copies of available information for a reasonable fee.  I understand that some of the potential risks of receiving the Services via telemedicine include:  Delay or interruption in medical evaluation due to technological equipment failure or disruption; Information transmitted may not be sufficient (e.g. poor  resolution of images) to allow for appropriate medical decision making by the Practitioner; and/or  In rare instances, security protocols could fail, causing a breach of personal health information.  Furthermore, I acknowledge that it is my responsibility to provide information about my medical history, conditions and care that is complete and accurate to the best of my ability. I acknowledge that Practitioner's advice, recommendations, and/or decision may be based on factors not within their control, such as incomplete or inaccurate data provided by me or distortions of diagnostic images or specimens that may result from electronic transmissions. I understand that the practice of medicine is not an exact science and that Practitioner makes no warranties or guarantees regarding treatment outcomes. I acknowledge that a copy of this consent can be made available to me via my patient portal ([facility]), or I can request a printed copy by calling the office of [facility].    I understand that my insurance will be billed for this visit.   I have read or had this consent read to me. I understand the contents of this consent, which adequately explains the benefits and risks of the Services being provided via telemedicine.  I have been provided ample opportunity to ask questions regarding this consent and the Services and have had my questions answered to my satisfaction. I give my informed consent for the services to be provided through the use of telemedicine in my medical care

## 2021-07-28 NOTE — Telephone Encounter (Signed)
Pt agreeable to plan of care for tele visit pre op 08/07/21 @ 10:20. Med rec and consent are done.

## 2021-07-28 NOTE — Telephone Encounter (Signed)
1st attempt to reach pt regarding surgical clearance and the need for a tele visit.  Left pt a message to call back and ask for the preop team.  

## 2021-07-30 ENCOUNTER — Other Ambulatory Visit: Payer: Self-pay | Admitting: Interventional Cardiology

## 2021-08-01 DIAGNOSIS — M5412 Radiculopathy, cervical region: Secondary | ICD-10-CM | POA: Diagnosis not present

## 2021-08-01 DIAGNOSIS — G5621 Lesion of ulnar nerve, right upper limb: Secondary | ICD-10-CM | POA: Diagnosis not present

## 2021-08-01 DIAGNOSIS — M4802 Spinal stenosis, cervical region: Secondary | ICD-10-CM | POA: Diagnosis not present

## 2021-08-07 ENCOUNTER — Ambulatory Visit (INDEPENDENT_AMBULATORY_CARE_PROVIDER_SITE_OTHER): Payer: Medicare PPO | Admitting: Nurse Practitioner

## 2021-08-07 ENCOUNTER — Encounter: Payer: Self-pay | Admitting: Nurse Practitioner

## 2021-08-07 DIAGNOSIS — Z01810 Encounter for preprocedural cardiovascular examination: Secondary | ICD-10-CM

## 2021-08-07 NOTE — Progress Notes (Signed)
   Virtual Visit via Telephone Note   Because of Betty Fox's co-morbid illnesses, she is at least at moderate risk for complications without adequate follow up.  This format is felt to be most appropriate for this patient at this time.  The patient did not have access to video technology/had technical difficulties with video requiring transitioning to audio format only (telephone).  All issues noted in this document were discussed and addressed.  No physical exam could be performed with this format.  Please refer to the patient's chart for her consent to telehealth for [facility].  Evaluation Performed:  Preoperative cardiovascular risk assessment _____________   Date:  08/07/2021   Patient ID:  Betty Fox, DOB 1945/10/26, MRN [id number] Patient Location:  Home Provider location:   Office  Primary Care Provider:  Kathyrn Lass, MD Primary Cardiologist:  Larae Grooms, MD  Chief Complaint / Patient Profile   76 y.o. y/o female with a h/o SVT, hypertension, hyperlipidemia, type 2 diabetes, and hypothyroidism who is pending C3-C7 Laminoplasty, date TBD, with Dr. Ivan Croft of [facility] and presents today for telephonic preoperative cardiovascular risk assessment.  Patient states she has postponed her surgery indefinitely and is currenlty seeking a second opinion.  Therefore, we will not proceed with cardiac clearance at this point in time.  I advised patient to have surgeon resend clearance request if she decides to proceed with surgery at a later date.  Patient verbalized understanding.   A copy of this note will be routed to requesting surgeon.  Time:   Today, I have spent 3 minutes with the patient with telehealth technology discussing medical history, symptoms, and management plan.     Lenna Sciara, NP  08/07/2021, 10:42 AM

## 2021-08-16 DIAGNOSIS — E785 Hyperlipidemia, unspecified: Secondary | ICD-10-CM | POA: Diagnosis not present

## 2021-08-16 DIAGNOSIS — J45909 Unspecified asthma, uncomplicated: Secondary | ICD-10-CM | POA: Diagnosis not present

## 2021-08-16 DIAGNOSIS — E1142 Type 2 diabetes mellitus with diabetic polyneuropathy: Secondary | ICD-10-CM | POA: Diagnosis not present

## 2021-08-16 DIAGNOSIS — I4891 Unspecified atrial fibrillation: Secondary | ICD-10-CM | POA: Diagnosis not present

## 2021-08-16 DIAGNOSIS — I10 Essential (primary) hypertension: Secondary | ICD-10-CM | POA: Diagnosis not present

## 2021-08-16 DIAGNOSIS — E669 Obesity, unspecified: Secondary | ICD-10-CM | POA: Diagnosis not present

## 2021-08-16 DIAGNOSIS — D6869 Other thrombophilia: Secondary | ICD-10-CM | POA: Diagnosis not present

## 2021-08-16 DIAGNOSIS — E039 Hypothyroidism, unspecified: Secondary | ICD-10-CM | POA: Diagnosis not present

## 2021-08-16 DIAGNOSIS — E876 Hypokalemia: Secondary | ICD-10-CM | POA: Diagnosis not present

## 2021-08-21 DIAGNOSIS — G5621 Lesion of ulnar nerve, right upper limb: Secondary | ICD-10-CM | POA: Diagnosis not present

## 2021-08-28 DIAGNOSIS — Z1389 Encounter for screening for other disorder: Secondary | ICD-10-CM | POA: Diagnosis not present

## 2021-08-28 DIAGNOSIS — Z0000 Encounter for general adult medical examination without abnormal findings: Secondary | ICD-10-CM | POA: Diagnosis not present

## 2021-08-30 DIAGNOSIS — M4722 Other spondylosis with radiculopathy, cervical region: Secondary | ICD-10-CM | POA: Diagnosis not present

## 2021-08-30 DIAGNOSIS — M47896 Other spondylosis, lumbar region: Secondary | ICD-10-CM | POA: Diagnosis not present

## 2021-08-30 DIAGNOSIS — M7701 Medial epicondylitis, right elbow: Secondary | ICD-10-CM | POA: Diagnosis not present

## 2021-08-31 DIAGNOSIS — R0989 Other specified symptoms and signs involving the circulatory and respiratory systems: Secondary | ICD-10-CM | POA: Diagnosis not present

## 2021-08-31 DIAGNOSIS — Z6833 Body mass index (BMI) 33.0-33.9, adult: Secondary | ICD-10-CM | POA: Diagnosis not present

## 2021-09-01 DIAGNOSIS — Z8639 Personal history of other endocrine, nutritional and metabolic disease: Secondary | ICD-10-CM | POA: Diagnosis not present

## 2021-09-01 DIAGNOSIS — E119 Type 2 diabetes mellitus without complications: Secondary | ICD-10-CM | POA: Diagnosis not present

## 2021-09-01 DIAGNOSIS — E039 Hypothyroidism, unspecified: Secondary | ICD-10-CM | POA: Diagnosis not present

## 2021-09-01 DIAGNOSIS — E063 Autoimmune thyroiditis: Secondary | ICD-10-CM | POA: Diagnosis not present

## 2021-09-11 DIAGNOSIS — M9961 Osseous and subluxation stenosis of intervertebral foramina of cervical region: Secondary | ICD-10-CM | POA: Diagnosis not present

## 2021-09-11 DIAGNOSIS — M5030 Other cervical disc degeneration, unspecified cervical region: Secondary | ICD-10-CM | POA: Diagnosis not present

## 2021-09-11 DIAGNOSIS — M50022 Cervical disc disorder at C5-C6 level with myelopathy: Secondary | ICD-10-CM | POA: Diagnosis not present

## 2021-09-11 DIAGNOSIS — M50021 Cervical disc disorder at C4-C5 level with myelopathy: Secondary | ICD-10-CM | POA: Diagnosis not present

## 2021-09-13 ENCOUNTER — Other Ambulatory Visit: Payer: Self-pay | Admitting: Orthopedic Surgery

## 2021-09-15 DIAGNOSIS — Z23 Encounter for immunization: Secondary | ICD-10-CM | POA: Diagnosis not present

## 2021-09-15 DIAGNOSIS — M47812 Spondylosis without myelopathy or radiculopathy, cervical region: Secondary | ICD-10-CM | POA: Diagnosis not present

## 2021-09-15 DIAGNOSIS — E1169 Type 2 diabetes mellitus with other specified complication: Secondary | ICD-10-CM | POA: Diagnosis not present

## 2021-09-15 DIAGNOSIS — Z6833 Body mass index (BMI) 33.0-33.9, adult: Secondary | ICD-10-CM | POA: Diagnosis not present

## 2021-09-15 DIAGNOSIS — J45909 Unspecified asthma, uncomplicated: Secondary | ICD-10-CM | POA: Diagnosis not present

## 2021-09-22 DIAGNOSIS — J3089 Other allergic rhinitis: Secondary | ICD-10-CM | POA: Diagnosis not present

## 2021-09-22 DIAGNOSIS — Z91011 Allergy to milk products: Secondary | ICD-10-CM | POA: Diagnosis not present

## 2021-09-22 DIAGNOSIS — K219 Gastro-esophageal reflux disease without esophagitis: Secondary | ICD-10-CM | POA: Diagnosis not present

## 2021-09-22 DIAGNOSIS — J4530 Mild persistent asthma, uncomplicated: Secondary | ICD-10-CM | POA: Diagnosis not present

## 2021-10-02 ENCOUNTER — Ambulatory Visit: Payer: Medicare PPO | Attending: Family Medicine | Admitting: Physical Therapy

## 2021-10-02 ENCOUNTER — Encounter: Payer: Self-pay | Admitting: Physical Therapy

## 2021-10-02 DIAGNOSIS — M542 Cervicalgia: Secondary | ICD-10-CM | POA: Insufficient documentation

## 2021-10-02 NOTE — Pre-Procedure Instructions (Signed)
Surgical Instructions    Your procedure is scheduled on Wednesday, September 27th.  Report to [facility] Main Entrance "A" at 05:30 A.M., then check in with the Admitting office.  Call this number if you have problems the morning of surgery:  [phone]   If you have any questions prior to your surgery date call [phone]: Open Monday-Friday 8am-4pm    Remember:  Do not eat after midnight the night before your surgery  You may drink clear liquids until 04:30 AM the morning of your surgery.   Clear liquids allowed are: Water, Non-Citrus Juices (without pulp), Carbonated Beverages, Clear Tea, Black Coffee Only (NO MILK, CREAM OR POWDERED CREAMER of any kind), and Gatorade.   Patient Instructions  The night before surgery:  No food after midnight. ONLY clear liquids after midnight  The day of surgery (if you do NOT have diabetes):  Drink ONE (1) Pre-Surgery Clear Ensure by 04:30 AM the morning of surgery. Drink in one sitting. Do not sip.  This drink was given to you during your hospital  pre-op appointment visit.  Nothing else to drink after completing the  Pre-Surgery Clear Ensure.  The day of surgery (if you have diabetes): Drink ONE (1) 12 oz G2 given to you in your pre admission testing appointment by 04:30 AM the morning of surgery. Drink in one sitting. Do not sip.  This drink was given to you during your hospital  pre-op appointment visit.  Nothing else to drink after completing the  12 oz bottle of G2.         If you have questions, please contact your surgeon's office.     Take these medicines the morning of surgery with A SIP OF WATER  amLODipine (NORVASC)  ARNUITY ELLIPTA atenolol (TENORMIN)  atorvastatin (LIPITOR)  fexofenadine (ALLEGRA)  fluticasone (FLONASE)  levothyroxine (SYNTHROID)   If needed: albuterol (VENTOLIN HFA)  sodium chloride (OCEAN) 0.65 % SOLN nasal spray   As of today, STOP taking any Aspirin (unless otherwise instructed by your  surgeon) Aleve, Naproxen, Ibuprofen, Motrin, Advil, Goody's, BC's, all herbal medications, fish oil, and all vitamins.   WHAT DO I DO ABOUT MY DIABETES MEDICATION?   Do not take Dulaglutide the morning of surgery.    HOW TO MANAGE YOUR DIABETES BEFORE AND AFTER SURGERY  Why is it important to control my blood sugar before and after surgery? Improving blood sugar levels before and after surgery helps healing and can limit problems. A way of improving blood sugar control is eating a healthy diet by:  Eating less sugar and carbohydrates  Increasing activity/exercise  Talking with your doctor about reaching your blood sugar goals High blood sugars (greater than 180 mg/dL) can raise your risk of infections and slow your recovery, so you will need to focus on controlling your diabetes during the weeks before surgery. Make sure that the doctor who takes care of your diabetes knows about your planned surgery including the date and location.  How do I manage my blood sugar before surgery? Check your blood sugar at least 4 times a day, starting 2 days before surgery, to make sure that the level is not too high or low.  Check your blood sugar the morning of your surgery when you wake up and every 2 hours until you get to the Short Stay unit.  If your blood sugar is less than 70 mg/dL, you will need to treat for low blood sugar: Do not take insulin. Treat a low blood sugar (  less than 70 mg/dL) with  cup of clear juice (cranberry or apple), 4 glucose tablets, OR glucose gel. Recheck blood sugar in 15 minutes after treatment (to make sure it is greater than 70 mg/dL). If your blood sugar is not greater than 70 mg/dL on recheck, call [phone] for further instructions. Report your blood sugar to the short stay nurse when you get to Short Stay.  If you are admitted to the hospital after surgery: Your blood sugar will be checked by the staff and you will probably be given insulin after surgery  (instead of oral diabetes medicines) to make sure you have good blood sugar levels. The goal for blood sugar control after surgery is 80-180 mg/dL.                     Do NOT Smoke (Tobacco/Vaping) for 24 hours prior to your procedure.  If you use a CPAP at night, you may bring your mask/headgear for your overnight stay.   Contacts, glasses, piercing's, hearing aid's, dentures or partials may not be worn into surgery, please bring cases for these belongings.    For patients admitted to the hospital, discharge time will be determined by your treatment team.   Patients discharged the day of surgery will not be allowed to drive home, and someone needs to stay with them for 24 hours.  SURGICAL WAITING ROOM VISITATION Patients having surgery or a procedure may have no more than 2 support people in the waiting area - these visitors may rotate.   Children under the age of 24 must have an adult with them who is not the patient. If the patient needs to stay at the hospital during part of their recovery, the visitor guidelines for inpatient rooms apply. Pre-op nurse will coordinate an appropriate time for 1 support person to accompany patient in pre-op.  This support person may not rotate.   Please refer to the [facility] website for the visitor guidelines for Inpatients (after your surgery is over and you are in a regular room).    Special instructions:   [facility]- Preparing For Surgery  Before surgery, you can play an important role. Because skin is not sterile, your skin needs to be as free of germs as possible. You can reduce the number of germs on your skin by washing with CHG (chlorahexidine gluconate) Soap before surgery.  CHG is an antiseptic cleaner which kills germs and bonds with the skin to continue killing germs even after washing.    Oral Hygiene is also important to reduce your risk of infection.  Remember - BRUSH YOUR TEETH THE MORNING OF SURGERY WITH YOUR REGULAR  TOOTHPASTE  Please do not use if you have an allergy to CHG or antibacterial soaps. If your skin becomes reddened/irritated stop using the CHG.  Do not shave (including legs and underarms) for at least 48 hours prior to first CHG shower. It is OK to shave your face.  Please follow these instructions carefully.   Shower the NIGHT BEFORE SURGERY and the MORNING OF SURGERY  If you chose to wash your hair, wash your hair first as usual with your normal shampoo.  After you shampoo, rinse your hair and body thoroughly to remove the shampoo.  Use CHG Soap as you would any other liquid soap. You can apply CHG directly to the skin and wash gently with a scrungie or a clean washcloth.   Apply the CHG Soap to your body ONLY FROM THE NECK DOWN.  Do not use on open wounds or open sores. Avoid contact with your eyes, ears, mouth and genitals (private parts). Wash Face and genitals (private parts)  with your normal soap.   Wash thoroughly, paying special attention to the area where your surgery will be performed.  Thoroughly rinse your body with warm water from the neck down.  DO NOT shower/wash with your normal soap after using and rinsing off the CHG Soap.  Pat yourself dry with a CLEAN TOWEL.  Wear CLEAN PAJAMAS to bed the night before surgery  Place CLEAN SHEETS on your bed the night before your surgery  DO NOT SLEEP WITH PETS.   Day of Surgery: Take a shower with CHG soap. Do not wear jewelry or makeup Do not wear lotions, powders, perfumes, or deodorant. Do not shave 48 hours prior to surgery.   Do not bring valuables to the hospital. [facility] is not responsible for any belongings or valuables. Do not wear nail polish, gel polish, artificial nails, or any other type of covering on natural nails (fingers and toes) If you have artificial nails or gel coating that need to be removed by a nail salon, please have this removed prior to surgery. Artificial nails or gel coating may interfere  with anesthesia's ability to adequately monitor your vital signs. Wear Clean/Comfortable clothing the morning of surgery Remember to brush your teeth WITH YOUR REGULAR TOOTHPASTE.   Please read over the following fact sheets that you were given.    If you received a COVID test during your pre-op visit  it is requested that you wear a mask when out in public, stay away from anyone that may not be feeling well and notify your surgeon if you develop symptoms. If you have been in contact with anyone that has tested positive in the last 10 days please notify you surgeon.

## 2021-10-02 NOTE — Therapy (Signed)
[place] [place], [place], [address] Phone: [phone]   Fax:  [phone]  Patient Details  Name: Betty Fox MRN: [id number] Date of Birth: [birth year] Referring Provider:  Kathyrn Lass, MD  Encounter Date: 10/02/2021  [name] arrived for eval but told me the following:  I'm getting ready to have surgery and they're going to open up the neck in the front, my spine is leaning on my esophagus and I'm having trouble with pain down my R UE. I'm losing use of my R UE. I think they're going to put discs and rods. If I don't have it done I'll be paralyzed from the neck down, since I've been taking gabapentin the pain in my arm is better but I still feel like there's something stabbing me. I decided to go on with surgery because if I were paralyzed from the neck down someone would have to bathe me and feed me and all of that. The surgery is on the 27th, next week.   Given that she is having major surgery next week on the same body region as is in the PT referral, I think it will be best for [place] to hold off on OP PT evaluation until after she completes the surgery and is cleared for return by her surgeon.   We will look forward to seeing her back post-op, thank you for the referral!    Ann Lions PT DPT PN2  10/02/2021, 1:27 PM   [address] [place], [place], [address] Phone: [phone]   Fax:  [phone]

## 2021-10-03 ENCOUNTER — Encounter (HOSPITAL_COMMUNITY): Payer: Self-pay

## 2021-10-03 ENCOUNTER — Encounter (HOSPITAL_COMMUNITY)
Admission: RE | Admit: 2021-10-03 | Discharge: 2021-10-03 | Disposition: A | Discharge status: Home / Self Care | Payer: Medicare PPO | Source: Ambulatory Visit | Attending: Orthopedic Surgery | Admitting: Orthopedic Surgery

## 2021-10-03 ENCOUNTER — Other Ambulatory Visit: Payer: Self-pay

## 2021-10-03 ENCOUNTER — Telehealth: Payer: Self-pay | Admitting: *Deleted

## 2021-10-03 VITALS — BP 139/70 | HR 56 | Temp 98.4°F | Resp 17 | Ht 62.0 in | Wt 180.4 lb

## 2021-10-03 DIAGNOSIS — Z01812 Encounter for preprocedural laboratory examination: Secondary | ICD-10-CM | POA: Insufficient documentation

## 2021-10-03 DIAGNOSIS — E119 Type 2 diabetes mellitus without complications: Secondary | ICD-10-CM | POA: Diagnosis not present

## 2021-10-03 DIAGNOSIS — I471 Supraventricular tachycardia, unspecified: Secondary | ICD-10-CM

## 2021-10-03 DIAGNOSIS — I119 Hypertensive heart disease without heart failure: Secondary | ICD-10-CM

## 2021-10-03 DIAGNOSIS — Z01818 Encounter for other preprocedural examination: Secondary | ICD-10-CM

## 2021-10-03 HISTORY — DX: Nausea with vomiting, unspecified: Z98.890

## 2021-10-03 HISTORY — DX: Personal history of other diseases of the digestive system: Z87.19

## 2021-10-03 HISTORY — DX: Gastro-esophageal reflux disease without esophagitis: K21.9

## 2021-10-03 HISTORY — DX: Cyst of kidney, acquired: N28.1

## 2021-10-03 HISTORY — DX: Type 2 diabetes mellitus without complications: E11.9

## 2021-10-03 LAB — CBC
HCT: 43.2 % (ref 36.0–46.0)
Hemoglobin: 14.4 g/dL (ref 12.0–15.0)
MCH: 31.5 pg (ref 26.0–34.0)
MCHC: 33.3 g/dL (ref 30.0–36.0)
MCV: 94.5 fL (ref 80.0–100.0)
Platelets: 212 10*3/uL (ref 150–400)
RBC: 4.57 MIL/uL (ref 3.87–5.11)
RDW: 12.7 % (ref 11.5–15.5)
WBC: 8.4 10*3/uL (ref 4.0–10.5)
nRBC: 0 % (ref 0.0–0.2)

## 2021-10-03 LAB — TYPE AND SCREEN
ABO/RH(D): A POS
Antibody Screen: NEGATIVE

## 2021-10-03 LAB — BASIC METABOLIC PANEL
Anion gap: 8 (ref 5–15)
BUN: 12 mg/dL (ref 8–23)
CO2: 26 mmol/L (ref 22–32)
Calcium: 9.9 mg/dL (ref 8.9–10.3)
Chloride: 107 mmol/L (ref 98–111)
Creatinine, Ser: 0.81 mg/dL (ref 0.44–1.00)
GFR, Estimated: >60 mL/min (ref >60)
Glucose, Bld: 103 mg/dL — ABNORMAL HIGH (ref 70–99)
Potassium: 4.2 mmol/L (ref 3.5–5.1)
Sodium: 141 mmol/L (ref 135–145)

## 2021-10-03 LAB — HEMOGLOBIN A1C
Hgb A1c MFr Bld: 5.7 % — ABNORMAL HIGH (ref 4.8–5.6)
Mean Plasma Glucose: 116.89 mg/dL

## 2021-10-03 LAB — GLUCOSE, CAPILLARY: Glucose-Capillary: 103 mg/dL — ABNORMAL HIGH (ref 70–99)

## 2021-10-03 LAB — SURGICAL PCR SCREEN
MRSA, PCR: NEGATIVE
Staphylococcus aureus: NEGATIVE

## 2021-10-03 NOTE — Telephone Encounter (Signed)
   Pre-operative Risk Assessment    Patient Name: Betty Fox  DOB: 1945-04-13 MRN: [id number]      Request for Surgical Clearance    Procedure:   ANTERIOR CERVICAL DECOMPRESSION FUSION C4-5, C5-6, C6-7 WITH INSTRUMENTATION AND ALLOGRAFT  Date of Surgery:  Clearance 10/11/21                                 Surgeon:  DR. MARK DUMONSKI Surgeon's Group or Practice Name:  [facility] Phone number:  [phone] ATTN: Neita Garnet Fax number:  [phone]   Type of Clearance Requested:   - Medical ; NO MEDICATIONS LISTED AS NEEDING TO BE HELD   Type of Anesthesia:  General    Additional requests/questions:    Jiles Prows   10/03/2021, 5:46 PM

## 2021-10-03 NOTE — Progress Notes (Signed)
PCP - Dr. Kathyrn Lass Cardiologist - Dr. Larae Grooms (Pt states she called the office and told them that she has decided to proceed with surgery since she last saw them but "no one called her back")  PPM/ICD - denies   Chest x-ray - 04/13/21 EKG - 04/14/21 Stress Test - denies ECHO - 07/27/09 Cardiac Cath - denies  Sleep Study - denies  DM- Type 2 Fasting Blood Sugar - 100-115 Checks Blood Sugar continuously throughout the day (continuous glucose monitor)  ASA/Blood Thinner Instructions: n/a   ERAS Protcol - yes PRE-SURGERY  G2- given at PAT  COVID TEST- n/a   Anesthesia review: yes, cardiac hx  Patient denies shortness of breath, fever, cough and chest pain at PAT appointment   All instructions explained to the patient, with a verbal understanding of the material. Patient agrees to go over the instructions while at home for a better understanding. The opportunity to ask questions was provided.

## 2021-10-04 DIAGNOSIS — K5900 Constipation, unspecified: Secondary | ICD-10-CM | POA: Diagnosis not present

## 2021-10-04 DIAGNOSIS — Z124 Encounter for screening for malignant neoplasm of cervix: Secondary | ICD-10-CM | POA: Diagnosis not present

## 2021-10-04 DIAGNOSIS — M816 Localized osteoporosis [Lequesne]: Secondary | ICD-10-CM | POA: Diagnosis not present

## 2021-10-04 DIAGNOSIS — Z6833 Body mass index (BMI) 33.0-33.9, adult: Secondary | ICD-10-CM | POA: Diagnosis not present

## 2021-10-04 DIAGNOSIS — R300 Dysuria: Secondary | ICD-10-CM | POA: Diagnosis not present

## 2021-10-04 DIAGNOSIS — N958 Other specified menopausal and perimenopausal disorders: Secondary | ICD-10-CM | POA: Diagnosis not present

## 2021-10-04 DIAGNOSIS — E039 Hypothyroidism, unspecified: Secondary | ICD-10-CM | POA: Diagnosis not present

## 2021-10-04 DIAGNOSIS — Z1151 Encounter for screening for human papillomavirus (HPV): Secondary | ICD-10-CM | POA: Diagnosis not present

## 2021-10-04 DIAGNOSIS — Z1231 Encounter for screening mammogram for malignant neoplasm of breast: Secondary | ICD-10-CM | POA: Diagnosis not present

## 2021-10-04 NOTE — Telephone Encounter (Signed)
Left message for the pt to call back for tele pre op appt.  ?

## 2021-10-04 NOTE — Progress Notes (Signed)
Anesthesia Chart Review:  [facility] cardiology for history of HTN, dyslipidemia, SVT.  She has remote history of pericarditis.  Echo 2011 showed normal biventricular function, impaired LV relaxation, essentially normal valves.  Last seen by Dr. Irish Lack on 05/23/2021.  SVT noted to be well controlled on atenolol.  No changes made to management.  Recommended follow-up in 1 year.  Seen by Nicholes Rough, PA-C 10/06/2021 via televisit for preop evaluation.  Per note, "Ms. Warshaw's perioperative risk of a major cardiac event is 0.9% according to the Revised Cardiac Risk Index (RCRI).  Therefore, she is at low risk for perioperative complications.   Her functional capacity is good at 5.81 METs according to the Duke Activity Status Index (DASI). Recommendations: According to ACC/AHA guidelines, no further cardiovascular testing needed.  The patient may proceed to surgery at acceptable risk."  Follows with rheumatology for history of Sjogren syndrome.  History of hiatal hernia.  Last dose of dulaglutide 09/29/2021.  Non-insulin-dependent DM2, well controlled, A1c 5.7 on preop labs.  Preop labs reviewed, WNL.  EKG 04/13/2021: NSR.  Rate 63.  Nonspecific T wave abnormality.   Wynonia Musty [facility] Short Stay Center/Anesthesiology Phone [phone] 10/06/2021 11:09 AM

## 2021-10-04 NOTE — Telephone Encounter (Signed)
   Name: Betty Fox  DOB: [birth year]  MRN: [id number]  Primary Cardiologist: Larae Grooms, MD   Preoperative team, please contact this patient and set up a phone call appointment for further preoperative risk assessment. Please obtain consent and complete medication review. Thank you for your help.  Had aborted pre-op tele visit 07/2021 with recommendation to r/s once surgery plan finalized, clearance not addressed at that time.  I confirm that guidance regarding antiplatelet and oral anticoagulation therapy has been completed and, if necessary, noted below. (Not on antiplatelet/anticoag per [facility].)   Charlie Pitter, PA-C 10/04/2021, 8:05 AM [place]

## 2021-10-05 ENCOUNTER — Telehealth: Payer: Self-pay | Admitting: *Deleted

## 2021-10-05 NOTE — Telephone Encounter (Signed)
Pt has been scheduled for a tele visit 10/06/21, consent on file, medications reconciled.

## 2021-10-05 NOTE — Telephone Encounter (Signed)
Pt has been scheduled for a tele visit, 10/06/21.  Consent on file / medications reconciled.    Patient Consent for Virtual Visit        Betty Fox has provided verbal consent on 10/05/2021 for a virtual visit (video or telephone).   CONSENT FOR VIRTUAL VISIT FOR:  Betty Fox  By participating in this virtual visit I agree to the following:  I hereby voluntarily request, consent and authorize [facility] and its employed or contracted physicians, physician assistants, nurse practitioners or other licensed health care professionals (the Practitioner), to provide me with telemedicine health care services (the "Services") as deemed necessary by the treating Practitioner. I acknowledge and consent to receive the Services by the Practitioner via telemedicine. I understand that the telemedicine visit will involve communicating with the Practitioner through live audiovisual communication technology and the disclosure of certain medical information by electronic transmission. I acknowledge that I have been given the opportunity to request an in-person assessment or other available alternative prior to the telemedicine visit and am voluntarily participating in the telemedicine visit.  I understand that I have the right to withhold or withdraw my consent to the use of telemedicine in the course of my care at any time, without affecting my right to future care or treatment, and that the Practitioner or I may terminate the telemedicine visit at any time. I understand that I have the right to inspect all information obtained and/or recorded in the course of the telemedicine visit and may receive copies of available information for a reasonable fee.  I understand that some of the potential risks of receiving the Services via telemedicine include:  Delay or interruption in medical evaluation due to technological equipment failure or disruption; Information transmitted may not be sufficient (e.g.  poor resolution of images) to allow for appropriate medical decision making by the Practitioner; and/or  In rare instances, security protocols could fail, causing a breach of personal health information.  Furthermore, I acknowledge that it is my responsibility to provide information about my medical history, conditions and care that is complete and accurate to the best of my ability. I acknowledge that Practitioner's advice, recommendations, and/or decision may be based on factors not within their control, such as incomplete or inaccurate data provided by me or distortions of diagnostic images or specimens that may result from electronic transmissions. I understand that the practice of medicine is not an exact science and that Practitioner makes no warranties or guarantees regarding treatment outcomes. I acknowledge that a copy of this consent can be made available to me via my patient portal ([facility]), or I can request a printed copy by calling the office of [facility].    I understand that my insurance will be billed for this visit.   I have read or had this consent read to me. I understand the contents of this consent, which adequately explains the benefits and risks of the Services being provided via telemedicine.  I have been provided ample opportunity to ask questions regarding this consent and the Services and have had my questions answered to my satisfaction. I give my informed consent for the services to be provided through the use of telemedicine in my medical care

## 2021-10-05 NOTE — Telephone Encounter (Signed)
Follow Up:      Patient is returning your call. 

## 2021-10-06 ENCOUNTER — Ambulatory Visit: Payer: Medicare PPO | Attending: Cardiology | Admitting: Physician Assistant

## 2021-10-06 DIAGNOSIS — Z01810 Encounter for preprocedural cardiovascular examination: Secondary | ICD-10-CM

## 2021-10-06 NOTE — Progress Notes (Signed)
Spoke with pt and she states she did not take her Dulaglutide today, per instructions. Last dose was 9/15.

## 2021-10-06 NOTE — Progress Notes (Signed)
Virtual Visit via Telephone Note   Because of Betty Fox's co-morbid illnesses, she is at least at moderate risk for complications without adequate follow up.  This format is felt to be most appropriate for this patient at this time.  The patient did not have access to video technology/had technical difficulties with video requiring transitioning to audio format only (telephone).  All issues noted in this document were discussed and addressed.  No physical exam could be performed with this format.  Please refer to the patient's chart for her consent to telehealth for [facility].  Evaluation Performed:  Preoperative cardiovascular risk assessment _____________   Date:  10/06/2021   Patient ID:  Betty Fox, DOB 07-17-1945, MRN [id number] Patient Location:  Home Provider location:   Office  Primary Care Provider:  Kathyrn Lass, MD Primary Cardiologist:  Larae Grooms, MD  Chief Complaint / Patient Profile   76 y.o. y/o female with a h/o SVT, hypertension, hyperlipidemia, type 2 diabetes mellitus, and hypothyroidism who is pending anterior cervical decompression fusion C4-5, C5-6, C6-7 with instrumentation and allograft and presents today for telephonic preoperative cardiovascular risk assessment.  Past Medical History    Past Medical History:  Diagnosis Date   Allergic rhinitis    Arthritis    Asthma    Balance problem    Colitis    hx of   Diabetes mellitus without complication (HCC)    Diverticulosis    Dyspepsia    Eczema    GERD (gastroesophageal reflux disease)    Hashimoto's thyroiditis    Headache    History of hiatal hernia    Hyperlipidemia    Hypertension    Hypothyroidism    Kidney stones    Nephrolithiasis    Osteopenia    Pericarditis 1999   Pneumonia    PONV (postoperative nausea and vomiting)    PSVT (paroxysmal supraventricular tachycardia) ([place])    Renal cyst    Shingles    Sjogren's syndrome ([place])    Tremors of nervous system     Uveitis    Vitamin D deficiency    Past Surgical History:  Procedure Laterality Date   BLADDER REPAIR     BREAST REDUCTION SURGERY Bilateral    DILATION AND CURETTAGE OF UTERUS     LAPAROSCOPIC ENDOMETRIOSIS FULGURATION     appendix removed as well per pt   laparotomy with oopherectomy Left 01/15/1977   MOUTH SURGERY  06/16/2014   TUBAL LIGATION     US ECHOCARDIOGRAPHY  07/27/2009   EF 55-60%    Allergies  Allergies  Allergen Reactions   Ampicillin Hives and Swelling   Ace Inhibitors Cough   Calcium Carbonate Antacid Hives   Prinivil [Lisinopril] Cough   Elemental Sulfur Itching   Esomeprazole Magnesium    Omeprazole Magnesium Hives   Penicillins Hives   Sulfa Antibiotics    Zoledronic Acid     Osteonecrosis of jaw   Ranitidine Hcl Rash    History of Present Illness    Betty Fox is a 76 y.o. female who presents via Engineer, civil (consulting) for a telehealth visit today.  Pt was last seen in cardiology clinic on 08/07/2021 by Diona Browner, NP.  At that time Betty Fox was doing well.  The patient is now pending procedure as outlined above. Since her last visit, she tells me she has been doing well from a cardiac standpoint.  She denies chest pain and shortness of breath.  She does state that she  was on a cruise and developed a cough.  [facility]  She has been limited due to her neck pain.  When she does walk she uses a walking stick.  She has no issue with going up stairs but coming down stairs can be challenging.  She does her own housework but when her arm starts to hurt she knows its time to stop.  She does not do any outdoor work due to her asthma.  She was doing some swing dancing and really enjoyed doing that prior to her neck issues.  Because of this she is scored a 5.81 METS on the DASI.  This exceeds the 4 METS minimum requirement.  She is not on any blood thinners.  Home Medications    Prior to Admission medications   Medication Sig  Start Date End Date Taking? Authorizing Provider  albuterol (VENTOLIN HFA) 108 (90 Base) MCG/ACT inhaler Inhale 1-2 puffs into the lungs every 6 (six) hours as needed for wheezing or shortness of breath. 02/13/21   [provider]  amLODipine (NORVASC) 10 MG tablet TAKE 1 TABLET BY MOUTH EVERY DAY 07/31/21   Jettie Booze, MD  ARNUITY ELLIPTA 100 MCG/ACT AEPB Inhale 2 puffs into the lungs daily. 04/07/15   [provider]  atenolol (TENORMIN) 25 MG tablet TAKE 1 TABLET BY MOUTH EVERY DAY 06/19/21   Jettie Booze, MD  atorvastatin (LIPITOR) 10 MG tablet Take 1 tablet (10 mg total) by mouth 5 days a week 07/27/21   Jettie Booze, MD  Cholecalciferol (VITAMIN D) 2000 UNITS tablet Take 2,000 Units by mouth daily.    [provider]  Cyanocobalamin (B-12 PO) Take 1 tablet by mouth daily.    [provider]  Dulaglutide 1.5 MG/0.5ML SOPN Inject 1.5 mg into the skin every Friday. 08/21/20   [provider]  EPINEPHrine 0.3 mg/0.3 mL IJ SOAJ injection Inject 0.3 mg into the muscle as needed for anaphylaxis.    [provider]  fexofenadine (ALLEGRA) 180 MG tablet Take 180 mg by mouth daily.    [provider]  fluticasone (FLONASE) 50 MCG/ACT nasal spray Place 2 sprays into both nostrils daily. 05/18/16   [provider]  gabapentin (NEURONTIN) 100 MG capsule Take 100 mg by mouth at bedtime. 09/26/21   [provider]  hydrochlorothiazide (HYDRODIURIL) 25 MG tablet TAKE 1 TABLET BY MOUTH EVERY OTHER DAY AS DIRECTED.CALL [phone] FOR FOLLOW UP APT 11/18/20   Jettie Booze, MD  levothyroxine (SYNTHROID) 88 MCG tablet Take 88 tablets by mouth daily before breakfast. 10/07/19   [provider]  potassium chloride (KLOR-CON) 10 MEQ tablet TAKE 1 TABLET BY MOUTH 2 TIMES DAILY. 11/18/20   Jettie Booze, MD  sodium chloride (OCEAN) 0.65 % SOLN nasal spray Place 1 spray into both nostrils as needed for  congestion.    [provider]  triamcinolone ointment (KENALOG) 0.1 % Apply 1 Application topically daily as needed (eczema). 09/22/21   [provider]    Physical Exam    Vital Signs:  Betty Fox does not have vital signs available for review today.  Given telephonic nature of communication, physical exam is limited. AAOx3. NAD. Normal affect.  Speech and respirations are unlabored.  Accessory Clinical Findings    None  Assessment & Plan    1.  Preoperative Cardiovascular Risk Assessment:  Betty Fox perioperative risk of a major cardiac event is 0.9% according to the Revised  Cardiac Risk Index (RCRI).  Therefore, she is at low risk for perioperative complications.   Her functional capacity is good at 5.81 METs according to the [facility] Activity Status Index (DASI). Recommendations: According to ACC/AHA guidelines, no further cardiovascular testing needed.  The patient may proceed to surgery at acceptable risk.     A copy of this note will be routed to requesting surgeon.  Time:   Today, I have spent 10 minutes with the patient with telehealth technology discussing medical history, symptoms, and management plan.     Elgie Collard, PA-C  10/06/2021, 9:41 AM

## 2021-10-06 NOTE — Anesthesia Preprocedure Evaluation (Signed)
Anesthesia Evaluation  Patient identified by MRN, date of birth, ID band Patient awake    Reviewed: Allergy & Precautions, NPO status , Patient's Chart, lab work & pertinent test results, reviewed documented beta blocker date and time   History of Anesthesia Complications (+) PONV and history of anesthetic complications  Airway Mallampati: III  TM Distance: >3 FB Neck ROM: Limited    Dental no notable dental hx.    Pulmonary asthma , pneumonia   Pulmonary exam normal breath sounds clear to auscultation       Cardiovascular hypertension, Pt. on medications and Pt. on home beta blockers Normal cardiovascular exam+ dysrhythmias Supra Ventricular Tachycardia  Rhythm:Regular Rate:Normal  EKG 04/14/21 NSR, Normal   Neuro/Psych  Headaches Essential tremor  negative psych ROS   GI/Hepatic Neg liver ROS, hiatal hernia,GERD  Medicated and Controlled,,Sjogren's syndrome   Endo/Other  diabetes, Well Controlled, Type 2Hypothyroidism  GLP-1 agonist therapy Hyperlipidemia Obesity  Renal/GU Renal diseaseHx/o renal calculi  negative genitourinary   Musculoskeletal  (+) Arthritis , Osteoarthritis,  Progressive cervical myelopathy right sided Neuro foraminal narrowing Right side with cord compression C4-C5, C5-C6, C6-C7  Eczema   Abdominal  (+) + obese  Peds  Hematology   Anesthesia Other Findings   Reproductive/Obstetrics                             Anesthesia Physical Anesthesia Plan  ASA: 3  Anesthesia Plan: General   Post-op Pain Management: Precedex and Tylenol PO (pre-op)*   Induction: Intravenous  PONV Risk Score and Plan: 4 or greater and Treatment may vary due to age or medical condition, Ondansetron, Dexamethasone and TIVA  Airway Management Planned: Oral ETT and Video Laryngoscope Planned  Additional Equipment: None  Intra-op Plan:   Post-operative Plan: Extubation in  OR  Informed Consent: I have reviewed the patients History and Physical, chart, labs and discussed the procedure including the risks, benefits and alternatives for the proposed anesthesia with the patient or authorized representative who has indicated his/her understanding and acceptance.     Dental advisory given  Plan Discussed with: CRNA and Anesthesiologist  Anesthesia Plan Comments: (PAT note by Karoline Caldwell, PA-C: [facility] for history of HTN, dyslipidemia, SVT.  She has remote history of pericarditis.  Echo 2011 showed normal biventricular function, impaired LV relaxation, essentially normal valves.  Last seen by Dr. Irish Lack on 05/23/2021.  SVT noted to be well controlled on atenolol.  No changes made to management.  Recommended follow-up in 1 year.  Seen by Nicholes Rough, PA-C 10/06/2021 via televisit for preop evaluation.  Per note, "Ms.Lamison's perioperative risk of a major cardiac event is0.9% according to the Revised Cardiac Risk Index (RCRI). Therefore, [facility]. Herfunctional capacity is goodat 5.81METs according to the [facility] Activity Status Index (DASI). Recommendations: According to ACC/AHA guidelines, no further cardiovascular testing needed. The patient may proceed to surgery at acceptable risk."  Follows with rheumatology for history of Sjogren syndrome.  History of hiatal hernia.  Last dose of dulaglutide 09/29/2021.  Non-insulin-dependent DM2, well controlled, A1c 5.7 on preop labs.  Preop labs reviewed, WNL.  EKG 04/13/2021: NSR.  Rate 63.  Nonspecific T wave abnormality. )        Anesthesia Quick Evaluation

## 2021-10-09 DIAGNOSIS — M50021 Cervical disc disorder at C4-C5 level with myelopathy: Secondary | ICD-10-CM | POA: Diagnosis not present

## 2021-10-10 ENCOUNTER — Encounter (HOSPITAL_COMMUNITY): Payer: Self-pay | Admitting: Orthopedic Surgery

## 2021-10-11 ENCOUNTER — Inpatient Hospital Stay (HOSPITAL_COMMUNITY): Payer: Medicare PPO | Admitting: Physician Assistant

## 2021-10-11 ENCOUNTER — Inpatient Hospital Stay (HOSPITAL_COMMUNITY): Payer: Medicare PPO | Admitting: Anesthesiology

## 2021-10-11 ENCOUNTER — Inpatient Hospital Stay (HOSPITAL_COMMUNITY)
Admission: RE | Disposition: A | Discharge status: Home / Self Care | Payer: Self-pay | Source: Home / Self Care | Attending: Orthopedic Surgery

## 2021-10-11 ENCOUNTER — Encounter (HOSPITAL_COMMUNITY): Payer: Self-pay | Admitting: Orthopedic Surgery

## 2021-10-11 ENCOUNTER — Inpatient Hospital Stay (HOSPITAL_COMMUNITY)
Admission: RE | Admit: 2021-10-11 | Discharge: 2021-10-11 | DRG: 029 | Disposition: A | Discharge status: Home / Self Care | Payer: Medicare PPO | Attending: Orthopedic Surgery | Admitting: Orthopedic Surgery

## 2021-10-11 ENCOUNTER — Inpatient Hospital Stay (HOSPITAL_COMMUNITY): Payer: Medicare PPO

## 2021-10-11 ENCOUNTER — Other Ambulatory Visit: Payer: Self-pay

## 2021-10-11 DIAGNOSIS — Z981 Arthrodesis status: Secondary | ICD-10-CM | POA: Diagnosis not present

## 2021-10-11 DIAGNOSIS — M4322 Fusion of spine, cervical region: Secondary | ICD-10-CM | POA: Diagnosis not present

## 2021-10-11 DIAGNOSIS — G959 Disease of spinal cord, unspecified: Secondary | ICD-10-CM

## 2021-10-11 DIAGNOSIS — Z888 Allergy status to other drugs, medicaments and biological substances status: Secondary | ICD-10-CM

## 2021-10-11 DIAGNOSIS — K219 Gastro-esophageal reflux disease without esophagitis: Secondary | ICD-10-CM | POA: Diagnosis present

## 2021-10-11 DIAGNOSIS — M1990 Unspecified osteoarthritis, unspecified site: Secondary | ICD-10-CM | POA: Diagnosis present

## 2021-10-11 DIAGNOSIS — G992 Myelopathy in diseases classified elsewhere: Secondary | ICD-10-CM | POA: Diagnosis not present

## 2021-10-11 DIAGNOSIS — I10 Essential (primary) hypertension: Secondary | ICD-10-CM

## 2021-10-11 DIAGNOSIS — Z882 Allergy status to sulfonamides status: Secondary | ICD-10-CM

## 2021-10-11 DIAGNOSIS — J45909 Unspecified asthma, uncomplicated: Secondary | ICD-10-CM | POA: Diagnosis not present

## 2021-10-11 DIAGNOSIS — M8580 Other specified disorders of bone density and structure, unspecified site: Secondary | ICD-10-CM | POA: Diagnosis present

## 2021-10-11 DIAGNOSIS — G549 Nerve root and plexus disorder, unspecified: Secondary | ICD-10-CM | POA: Diagnosis present

## 2021-10-11 DIAGNOSIS — Z79899 Other long term (current) drug therapy: Secondary | ICD-10-CM | POA: Diagnosis not present

## 2021-10-11 DIAGNOSIS — Z87442 Personal history of urinary calculi: Secondary | ICD-10-CM

## 2021-10-11 DIAGNOSIS — E063 Autoimmune thyroiditis: Secondary | ICD-10-CM | POA: Diagnosis present

## 2021-10-11 DIAGNOSIS — M4802 Spinal stenosis, cervical region: Secondary | ICD-10-CM | POA: Diagnosis present

## 2021-10-11 DIAGNOSIS — M2578 Osteophyte, vertebrae: Secondary | ICD-10-CM | POA: Diagnosis present

## 2021-10-11 DIAGNOSIS — E785 Hyperlipidemia, unspecified: Secondary | ICD-10-CM | POA: Diagnosis not present

## 2021-10-11 DIAGNOSIS — Z880 Allergy status to penicillin: Secondary | ICD-10-CM

## 2021-10-11 DIAGNOSIS — Z7984 Long term (current) use of oral hypoglycemic drugs: Secondary | ICD-10-CM

## 2021-10-11 DIAGNOSIS — M5412 Radiculopathy, cervical region: Principal | ICD-10-CM | POA: Diagnosis present

## 2021-10-11 DIAGNOSIS — M3500 Sicca syndrome, unspecified: Secondary | ICD-10-CM | POA: Diagnosis not present

## 2021-10-11 DIAGNOSIS — E119 Type 2 diabetes mellitus without complications: Secondary | ICD-10-CM | POA: Diagnosis not present

## 2021-10-11 DIAGNOSIS — M50021 Cervical disc disorder at C4-C5 level with myelopathy: Secondary | ICD-10-CM | POA: Diagnosis not present

## 2021-10-11 DIAGNOSIS — Z881 Allergy status to other antibiotic agents status: Secondary | ICD-10-CM

## 2021-10-11 DIAGNOSIS — M4602 Spinal enthesopathy, cervical region: Secondary | ICD-10-CM | POA: Diagnosis not present

## 2021-10-11 DIAGNOSIS — M5010 Cervical disc disorder with radiculopathy, unspecified cervical region: Principal | ICD-10-CM

## 2021-10-11 HISTORY — PX: ANTERIOR CERVICAL DECOMPRESSION/DISCECTOMY FUSION 4 LEVELS: SHX5556

## 2021-10-11 LAB — ABO/RH: ABO/RH(D): A POS

## 2021-10-11 LAB — GLUCOSE, CAPILLARY
Glucose-Capillary: 132 mg/dL — ABNORMAL HIGH (ref 70–99)
Glucose-Capillary: 151 mg/dL — ABNORMAL HIGH (ref 70–99)

## 2021-10-11 SURGERY — ANTERIOR CERVICAL DECOMPRESSION/DISCECTOMY FUSION 4 LEVELS
Anesthesia: General | Site: Spine Cervical

## 2021-10-11 MED ORDER — 0.9 % SODIUM CHLORIDE (POUR BTL) OPTIME
TOPICAL | Status: DC | PRN
  Administered 2021-10-11: 1000 mL

## 2021-10-11 MED ORDER — AMISULPRIDE (ANTIEMETIC) 5 MG/2ML IV SOLN
INTRAVENOUS | Status: AC
  Filled 2021-10-11: qty 4

## 2021-10-11 MED ORDER — VANCOMYCIN HCL IN DEXTROSE 1-5 GM/200ML-% IV SOLN
1000.0000 mg | INTRAVENOUS | Status: AC
  Administered 2021-10-11: 1000 mg via INTRAVENOUS
  Filled 2021-10-11: qty 200

## 2021-10-11 MED ORDER — INSULIN ASPART 100 UNIT/ML IJ SOLN: 0.0000 [IU] | INTRAMUSCULAR | Status: DC | PRN

## 2021-10-11 MED ORDER — PHENYLEPHRINE HCL-NACL 20-0.9 MG/250ML-% IV SOLN
INTRAVENOUS | Status: DC | PRN
  Administered 2021-10-11: 50 ug/min via INTRAVENOUS

## 2021-10-11 MED ORDER — MIDAZOLAM HCL 2 MG/2ML IJ SOLN
INTRAMUSCULAR | Status: AC
  Filled 2021-10-11: qty 2

## 2021-10-11 MED ORDER — THROMBIN 20000 UNITS EX SOLR
CUTANEOUS | Status: DC | PRN
  Administered 2021-10-11: 20000 [IU] via TOPICAL

## 2021-10-11 MED ORDER — FENTANYL CITRATE (PF) 250 MCG/5ML IJ SOLN
INTRAMUSCULAR | Status: DC | PRN
  Administered 2021-10-11: 50 ug via INTRAVENOUS
  Administered 2021-10-11: 100 ug via INTRAVENOUS

## 2021-10-11 MED ORDER — ONDANSETRON HCL 4 MG/2ML IJ SOLN
INTRAMUSCULAR | Status: DC | PRN
  Administered 2021-10-11 (×2): 4 mg via INTRAVENOUS

## 2021-10-11 MED ORDER — HYDROMORPHONE HCL 1 MG/ML IJ SOLN
0.2500 mg | INTRAMUSCULAR | Status: DC | PRN
  Administered 2021-10-11: 0.25 mg via INTRAVENOUS
  Administered 2021-10-11: 0.5 mg via INTRAVENOUS

## 2021-10-11 MED ORDER — FENTANYL CITRATE (PF) 250 MCG/5ML IJ SOLN
INTRAMUSCULAR | Status: AC
  Filled 2021-10-11: qty 5

## 2021-10-11 MED ORDER — APREPITANT 40 MG PO CAPS: 40.0000 mg | ORAL_CAPSULE | Freq: Once | ORAL | Status: DC

## 2021-10-11 MED ORDER — AMISULPRIDE (ANTIEMETIC) 5 MG/2ML IV SOLN
10.0000 mg | Freq: Once | INTRAVENOUS | Status: AC | PRN
  Administered 2021-10-11: 10 mg via INTRAVENOUS

## 2021-10-11 MED ORDER — CHLORHEXIDINE GLUCONATE 0.12 % MT SOLN
OROMUCOSAL | Status: AC
  Administered 2021-10-11: 15 mL
  Filled 2021-10-11: qty 15

## 2021-10-11 MED ORDER — THROMBIN 20000 UNITS EX SOLR: OROMUCOSAL | Status: DC | PRN

## 2021-10-11 MED ORDER — LACTATED RINGERS IV SOLN: INTRAVENOUS | Status: DC

## 2021-10-11 MED ORDER — POVIDONE-IODINE 7.5 % EX SOLN
Freq: Once | CUTANEOUS | Status: DC
  Filled 2021-10-11: qty 118

## 2021-10-11 MED ORDER — PROPOFOL 10 MG/ML IV BOLUS
INTRAVENOUS | Status: DC | PRN
  Administered 2021-10-11: 100 mg via INTRAVENOUS

## 2021-10-11 MED ORDER — OXYCODONE HCL 5 MG PO TABS
5.0000 mg | ORAL_TABLET | Freq: Once | ORAL | Status: AC | PRN
  Administered 2021-10-11: 5 mg via ORAL

## 2021-10-11 MED ORDER — OXYCODONE HCL 5 MG/5ML PO SOLN: 5.0000 mg | Freq: Once | ORAL | Status: AC | PRN

## 2021-10-11 MED ORDER — LIDOCAINE 2% (20 MG/ML) 5 ML SYRINGE
INTRAMUSCULAR | Status: DC | PRN
  Administered 2021-10-11: 60 mg via INTRAVENOUS

## 2021-10-11 MED ORDER — HYDROCODONE-ACETAMINOPHEN 5-325 MG PO TABS: 1.0000 | ORAL_TABLET | Freq: Four times a day (QID) | ORAL | 0 refills | Status: AC | PRN

## 2021-10-11 MED ORDER — LEVOTHYROXINE SODIUM 88 MCG PO TABS: 88.0000 ug | ORAL_TABLET | Freq: Every day | ORAL | 0 refills | Status: AC

## 2021-10-11 MED ORDER — BUPIVACAINE-EPINEPHRINE (PF) 0.25% -1:200000 IJ SOLN
INTRAMUSCULAR | Status: AC
  Filled 2021-10-11: qty 30

## 2021-10-11 MED ORDER — DEXAMETHASONE SODIUM PHOSPHATE 10 MG/ML IJ SOLN
INTRAMUSCULAR | Status: DC | PRN
  Administered 2021-10-11 (×2): 10 mg via INTRAVENOUS

## 2021-10-11 MED ORDER — MIDAZOLAM HCL 2 MG/2ML IJ SOLN
INTRAMUSCULAR | Status: DC | PRN
  Administered 2021-10-11: 1 mg via INTRAVENOUS

## 2021-10-11 MED ORDER — ACETAMINOPHEN 500 MG PO TABS: 1000.0000 mg | ORAL_TABLET | Freq: Once | ORAL | Status: DC

## 2021-10-11 MED ORDER — BUPIVACAINE-EPINEPHRINE 0.25% -1:200000 IJ SOLN
INTRAMUSCULAR | Status: DC | PRN
  Administered 2021-10-11: 7 mL

## 2021-10-11 MED ORDER — ROCURONIUM BROMIDE 10 MG/ML (PF) SYRINGE
PREFILLED_SYRINGE | INTRAVENOUS | Status: DC | PRN
  Administered 2021-10-11: 60 mg via INTRAVENOUS
  Administered 2021-10-11: 40 mg via INTRAVENOUS

## 2021-10-11 MED ORDER — METHOCARBAMOL 750 MG PO TABS: 750.0000 mg | ORAL_TABLET | Freq: Four times a day (QID) | ORAL | 0 refills | Status: AC | PRN

## 2021-10-11 MED ORDER — PROPOFOL 500 MG/50ML IV EMUL
INTRAVENOUS | Status: DC | PRN
  Administered 2021-10-11: 150 ug/kg/min via INTRAVENOUS

## 2021-10-11 MED ORDER — CHLORHEXIDINE GLUCONATE 0.12 % MT SOLN
15.0000 mL | OROMUCOSAL | Status: AC
  Filled 2021-10-11: qty 15

## 2021-10-11 MED ORDER — OXYCODONE HCL 5 MG PO TABS
ORAL_TABLET | ORAL | Status: AC
  Filled 2021-10-11: qty 1

## 2021-10-11 MED ORDER — THROMBIN 20000 UNITS EX KIT
PACK | CUTANEOUS | Status: AC
  Filled 2021-10-11: qty 1

## 2021-10-11 MED ORDER — SUGAMMADEX SODIUM 200 MG/2ML IV SOLN
INTRAVENOUS | Status: DC | PRN
  Administered 2021-10-11: 161.4 mg via INTRAVENOUS

## 2021-10-11 MED ORDER — HYDROMORPHONE HCL 1 MG/ML IJ SOLN
INTRAMUSCULAR | Status: AC
  Filled 2021-10-11: qty 1

## 2021-10-11 MED ORDER — ONDANSETRON HCL 4 MG/2ML IJ SOLN: 4.0000 mg | Freq: Once | INTRAMUSCULAR | Status: DC | PRN

## 2021-10-11 SURGICAL SUPPLY — 84 items
AGENT HMST KT MTR STRL THRMB (HEMOSTASIS)
APL SKNCLS STERI-STRIP NONHPOA (GAUZE/BANDAGES/DRESSINGS) ×1
BAG COUNTER SPONGE SURGICOUNT (BAG) ×1 IMPLANT
BAG SPNG CNTER NS LX DISP (BAG) ×1
BENZOIN TINCTURE PRP APPL 2/3 (GAUZE/BANDAGES/DRESSINGS) ×1 IMPLANT
BIT DRILL NEURO 2X3.1 SFT TUCH (MISCELLANEOUS) ×1 IMPLANT
BIT DRILL SRG 14X2.2XFLT CHK (BIT) IMPLANT
BIT DRL SRG 14X2.2XFLT CHK (BIT) ×1
BLADE CLIPPER SURG (BLADE) ×1 IMPLANT
BLADE SURG 15 STRL LF DISP TIS (BLADE) ×1 IMPLANT
BLADE SURG 15 STRL SS (BLADE) ×1
BONE VIVIGEN FORMABLE 1.3CC (Bone Implant) ×2 IMPLANT
BUR MATCHSTICK NEURO 3.0 LAGG (BURR) IMPLANT
BUR ROUND FLUTED 4 SOFT TCH (BURR) IMPLANT
CARTRIDGE OIL MAESTRO DRILL (MISCELLANEOUS) ×1 IMPLANT
COVER SURGICAL LIGHT HANDLE (MISCELLANEOUS) ×1 IMPLANT
DEFOGGER ANTIFOG KIT (MISCELLANEOUS) IMPLANT
DEVICE ENDSKLTN IMPLANT SM 7MM (Cage) IMPLANT
DIFFUSER DRILL AIR PNEUMATIC (MISCELLANEOUS) ×1 IMPLANT
DRAIN JACKSON RD 7FR 3/32 (WOUND CARE) IMPLANT
DRAPE C-ARM 42X72 X-RAY (DRAPES) ×1 IMPLANT
DRAPE POUCH INSTRU U-SHP 10X18 (DRAPES) ×1 IMPLANT
DRAPE SURG 17X23 STRL (DRAPES) ×3 IMPLANT
DRILL BIT SKYLINE 14MM (BIT) ×1
DRILL NEURO 2X3.1 SOFT TOUCH (MISCELLANEOUS) ×1
DURAPREP 26ML APPLICATOR (WOUND CARE) ×1 IMPLANT
ELECT COATED BLADE 2.86 ST (ELECTRODE) ×1 IMPLANT
ELECT REM PT RETURN 9FT ADLT (ELECTROSURGICAL) ×1
ELECTRODE REM PT RTRN 9FT ADLT (ELECTROSURGICAL) ×1 IMPLANT
ENDOSKELETON IMPLANT SM 7MM (Cage) ×2 IMPLANT
EVACUATOR SILICONE 100CC (DRAIN) IMPLANT
GAUZE 4X4 16PLY ~~LOC~~+RFID DBL (SPONGE) ×1 IMPLANT
GAUZE SPONGE 4X4 12PLY STRL (GAUZE/BANDAGES/DRESSINGS) ×1 IMPLANT
GAUZE SPONGE 4X4 12PLY STRL LF (GAUZE/BANDAGES/DRESSINGS) IMPLANT
GLOVE BIO SURGEON STRL SZ7 (GLOVE) ×1 IMPLANT
GLOVE BIO SURGEON STRL SZ8 (GLOVE) ×1 IMPLANT
GLOVE BIOGEL PI IND STRL 7.0 (GLOVE) ×2 IMPLANT
GLOVE BIOGEL PI IND STRL 8 (GLOVE) ×1 IMPLANT
GLOVE SURG ENC MOIS LTX SZ6.5 (GLOVE) ×1 IMPLANT
GOWN STRL REUS W/ TWL LRG LVL3 (GOWN DISPOSABLE) ×1 IMPLANT
GOWN STRL REUS W/ TWL XL LVL3 (GOWN DISPOSABLE) ×1 IMPLANT
GOWN STRL REUS W/TWL LRG LVL3 (GOWN DISPOSABLE) ×1
GOWN STRL REUS W/TWL XL LVL3 (GOWN DISPOSABLE) ×1
GRAFT BNE MATRIX VG FRMBL SM 1 (Bone Implant) IMPLANT
IMPL S ENDOSKEL TC 7 ODEG (Orthopedic Implant) IMPLANT
IMPLANT S ENDOSKEL TC 7 ODEG (Orthopedic Implant) ×1 IMPLANT
IV CATH 14GX2 1/4 (CATHETERS) ×1 IMPLANT
KIT BASIN OR (CUSTOM PROCEDURE TRAY) ×1 IMPLANT
KIT TURNOVER KIT B (KITS) ×1 IMPLANT
MANIFOLD NEPTUNE II (INSTRUMENTS) ×1 IMPLANT
NDL PRECISIONGLIDE 27X1.5 (NEEDLE) ×1 IMPLANT
NDL SPNL 20GX3.5 QUINCKE YW (NEEDLE) ×1 IMPLANT
NEEDLE PRECISIONGLIDE 27X1.5 (NEEDLE) ×1 IMPLANT
NEEDLE SPNL 20GX3.5 QUINCKE YW (NEEDLE) ×1 IMPLANT
NS IRRIG 1000ML POUR BTL (IV SOLUTION) ×1 IMPLANT
OIL CARTRIDGE MAESTRO DRILL (MISCELLANEOUS)
PACK ORTHO CERVICAL (CUSTOM PROCEDURE TRAY) ×1 IMPLANT
PAD ARMBOARD 7.5X6 YLW CONV (MISCELLANEOUS) ×2 IMPLANT
PATTIES SURGICAL .5 X.5 (GAUZE/BANDAGES/DRESSINGS) IMPLANT
PATTIES SURGICAL .5 X1 (DISPOSABLE) IMPLANT
PENCIL BUTTON HOLSTER BLD 10FT (ELECTRODE) IMPLANT
PIN DISTRACTION 14 (PIN) IMPLANT
PLATE SKYLINE 3LVL 45MM CERV (Plate) IMPLANT
POSITIONER HEAD DONUT 9IN (MISCELLANEOUS) ×1 IMPLANT
SCREW SKYLINE VAR OS 14MM (Screw) IMPLANT
SPIKE FLUID TRANSFER (MISCELLANEOUS) ×1 IMPLANT
SPONGE INTESTINAL PEANUT (DISPOSABLE) ×2 IMPLANT
SPONGE SURGIFOAM ABS GEL 100 (HEMOSTASIS) ×1 IMPLANT
STRIP CLOSURE SKIN 1/2X4 (GAUZE/BANDAGES/DRESSINGS) ×1 IMPLANT
STRIP CLOSURE SKIN 1/4X4 (GAUZE/BANDAGES/DRESSINGS) IMPLANT
SURGIFLO W/THROMBIN 8M KIT (HEMOSTASIS) IMPLANT
SUT BONE WAX W31G (SUTURE) IMPLANT
SUT MNCRL AB 4-0 PS2 18 (SUTURE) ×1 IMPLANT
SUT VIC AB 2-0 CT2 18 VCP726D (SUTURE) ×1 IMPLANT
SYR BULB IRRIG 60ML STRL (SYRINGE) ×1 IMPLANT
SYR CONTROL 10ML LL (SYRINGE) ×2 IMPLANT
TAPE CLOTH 4X10 WHT NS (GAUZE/BANDAGES/DRESSINGS) ×1 IMPLANT
TAPE CLOTH SURG 4X10 WHT LF (GAUZE/BANDAGES/DRESSINGS) IMPLANT
TAPE UMBILICAL COTTON 1/8X30 (MISCELLANEOUS) ×1 IMPLANT
TOWEL GREEN STERILE (TOWEL DISPOSABLE) ×1 IMPLANT
TOWEL GREEN STERILE FF (TOWEL DISPOSABLE) ×1 IMPLANT
TRAY FOLEY MTR SLVR 16FR STAT (SET/KITS/TRAYS/PACK) ×1 IMPLANT
WATER STERILE IRR 1000ML POUR (IV SOLUTION) ×1 IMPLANT
YANKAUER SUCT BULB TIP NO VENT (SUCTIONS) ×1 IMPLANT

## 2021-10-11 NOTE — Anesthesia Postprocedure Evaluation (Signed)
Anesthesia Post Note  Patient: Betty Fox  Procedure(s) Performed: ANTERIOR CERVICAL DECOMPRESSION FUSION CERVICAL 4- CERVICAL 5, CERVICAL 5- CERVICAL 6, CERVICAL 6- CERVICAL 7 WITH INSTRUMENTATION AND ALLOGRAFT (Spine Cervical)     Patient location during evaluation: PACU Anesthesia Type: General Level of consciousness: awake and alert and oriented Pain management: pain level controlled Vital Signs Assessment: post-procedure vital signs reviewed and stable Respiratory status: spontaneous breathing, nonlabored ventilation and respiratory function stable Cardiovascular status: blood pressure returned to baseline and stable Postop Assessment: no apparent nausea or vomiting Anesthetic complications: no   No notable events documented.  Last Vitals:  Vitals:   10/11/21 1145 10/11/21 1200  BP: 107/61 110/62  Pulse: (!) 58 60  Resp: 17 20  Temp:    SpO2: 93% 92%    Last Pain:  Vitals:   10/11/21 1145  TempSrc:   PainSc: 7                  Claudett Bayly A.

## 2021-10-11 NOTE — Anesthesia Procedure Notes (Signed)
Procedure Name: Intubation Date/Time: 10/11/2021 7:45 AM  Performed by: Minerva Ends, CRNAPre-anesthesia Checklist: Patient identified, Emergency Drugs available, Suction available and Patient being monitored Patient Re-evaluated:Patient Re-evaluated prior to induction Oxygen Delivery Method: Circle system utilized Preoxygenation: Pre-oxygenation with 100% oxygen Induction Type: IV induction Ventilation: Mask ventilation without difficulty Laryngoscope Size: Mac, 3 and Glidescope Grade View: Grade I Tube type: Oral Number of attempts: 1 Airway Equipment and Method: Stylet and Oral airway Placement Confirmation: ETT inserted through vocal cords under direct vision, positive ETCO2 and breath sounds checked- equal and bilateral Secured at: 21 cm Tube secured with: Tape Dental Injury: Teeth and Oropharynx as per pre-operative assessment

## 2021-10-11 NOTE — H&P (Signed)
PREOPERATIVE H&P  Chief Complaint: Balance deterioration, right arm pain  HPI: Betty Fox is a 76 y.o. female who presents with ongoing pain in the right arm and deterioration in balance   MRI reveals cord compression and neuroforaminal stenosis spanning C4-C7  Patient has failed multiple forms of conservative care and continues to have pain (see office notes for additional details regarding the patient's full course of treatment)  Past Medical History:  Diagnosis Date   Allergic rhinitis    Arthritis    Asthma    Balance problem    Colitis    hx of   Diabetes mellitus without complication (HCC)    Diverticulosis    Dyspepsia    Eczema    GERD (gastroesophageal reflux disease)    Hashimoto's thyroiditis    Headache    History of hiatal hernia    Hyperlipidemia    Hypertension    Hypothyroidism    Kidney stones    Nephrolithiasis    Osteopenia    Pericarditis 1999   Pneumonia    PONV (postoperative nausea and vomiting)    PSVT (paroxysmal supraventricular tachycardia) ([place])    Renal cyst    Shingles    Sjogren's syndrome ([place])    Tremors of nervous system    Uveitis    Vitamin D deficiency    Past Surgical History:  Procedure Laterality Date   BLADDER REPAIR     BREAST REDUCTION SURGERY Bilateral    DILATION AND CURETTAGE OF UTERUS     LAPAROSCOPIC ENDOMETRIOSIS FULGURATION     appendix removed as well per pt   laparotomy with oopherectomy Left 01/15/1977   MOUTH SURGERY  06/16/2014   TUBAL LIGATION     US ECHOCARDIOGRAPHY  07/27/2009   EF 55-60%   Social History   Socioeconomic History   Marital status: Married    Spouse name: Not on file   Number of children: 1   Years of education: grad school   Highest education level: Not on file  Occupational History   Occupation: retired Pharmacist, hospital  Tobacco Use   Smoking status: Never   Smokeless tobacco: Never  Vaping Use   Vaping Use: Never used  Substance and Sexual Activity   Alcohol use: Yes     Comment: 1 drink per year   Drug use: Never   Sexual activity: Not on file  Other Topics Concern   Not on file  Social History Narrative   Lives with husband   Caffeine- coffee 1 daily, occas tea   Right handed predominately   Social Determinants of Health   Financial Resource Strain: Not on file  Food Insecurity: Not on file  Transportation Needs: Not on file  Physical Activity: Not on file  Stress: Not on file  Social Connections: Not on file   Family History  Adopted: Yes  Problem Relation Age of Onset   Colon cancer Neg Hx    Rectal cancer Neg Hx    Allergies  Allergen Reactions   Ampicillin Hives and Swelling   Ace Inhibitors Cough   Calcium Carbonate Antacid Hives   Prinivil [Lisinopril] Cough   Elemental Sulfur Itching   Esomeprazole Magnesium    Omeprazole Magnesium Hives   Penicillins Hives   Sulfa Antibiotics    Zoledronic Acid     Osteonecrosis of jaw   Ranitidine Hcl Rash   Prior to Admission medications   Medication Sig Start Date End Date Taking? Authorizing Provider  albuterol (VENTOLIN HFA) 108 (  90 Base) MCG/ACT inhaler Inhale 1-2 puffs into the lungs every 6 (six) hours as needed for wheezing or shortness of breath. 02/13/21  Yes [provider]  amLODipine (NORVASC) 10 MG tablet TAKE 1 TABLET BY MOUTH EVERY DAY 07/31/21  Yes Jettie Booze, MD  ARNUITY ELLIPTA 100 MCG/ACT AEPB Inhale 2 puffs into the lungs daily. 04/07/15  Yes [provider]  atenolol (TENORMIN) 25 MG tablet TAKE 1 TABLET BY MOUTH EVERY DAY 06/19/21  Yes Jettie Booze, MD  atorvastatin (LIPITOR) 10 MG tablet Take 1 tablet (10 mg total) by mouth 5 days a week 07/27/21  Yes Jettie Booze, MD  Cholecalciferol (VITAMIN D) 2000 UNITS tablet Take 2,000 Units by mouth daily.   Yes [provider]  Cyanocobalamin (B-12 PO) Take 1 tablet by mouth daily.   Yes [provider]  Dulaglutide 1.5 MG/0.5ML SOPN Inject 1.5 mg into the skin every  Friday. 08/21/20  Yes [provider]  EPINEPHrine 0.3 mg/0.3 mL IJ SOAJ injection Inject 0.3 mg into the muscle as needed for anaphylaxis.   Yes [provider]  fexofenadine (ALLEGRA) 180 MG tablet Take 180 mg by mouth daily.   Yes [provider]  fluticasone (FLONASE) 50 MCG/ACT nasal spray Place 2 sprays into both nostrils daily. 05/18/16  Yes [provider]  gabapentin (NEURONTIN) 100 MG capsule Take 100 mg by mouth at bedtime. 09/26/21  Yes [provider]  hydrochlorothiazide (HYDRODIURIL) 25 MG tablet TAKE 1 TABLET BY MOUTH EVERY OTHER DAY AS DIRECTED.CALL [phone] FOR FOLLOW UP APT 11/18/20  Yes Jettie Booze, MD  levothyroxine (SYNTHROID) 88 MCG tablet Take 88 tablets by mouth daily before breakfast. 10/07/19  Yes [provider]  potassium chloride (KLOR-CON) 10 MEQ tablet TAKE 1 TABLET BY MOUTH 2 TIMES DAILY. 11/18/20  Yes Jettie Booze, MD  sodium chloride (OCEAN) 0.65 % SOLN nasal spray Place 1 spray into both nostrils as needed for congestion.   Yes [provider]  triamcinolone ointment (KENALOG) 0.1 % Apply 1 Application topically daily as needed (eczema). 09/22/21  Yes [provider]     All other systems have been reviewed and were otherwise negative with the exception of those mentioned in the HPI and as above.  Physical Exam: Vitals:   10/11/21 0614  BP: (!) 153/70  Pulse: (!) 56  Resp: 18  Temp: 97.7 F (36.5 C)  SpO2: 96%    Body mass index is 32.04 kg/m.  General: Alert, no acute distress Cardiovascular: No pedal edema Respiratory: No cyanosis, no use of accessory musculature Skin: No lesions in the area of chief complaint Neurologic: Sensation intact distally Psychiatric: Patient is competent for consent with normal mood and affect Lymphatic: No axillary or cervical lymphadenopathy   Assessment/Plan: Progressive cervical myelopathy, with right-sided cervical  radiculopathy, with cord compression and neuroforaminal narrowing identified at C4-5, C5-6, and C6-7 Plan for Procedure(s): ANTERIOR CERVICAL DECOMPRESSION FUSION CERVICAL 4- CERVICAL 5, CERVICAL 5- CERVICAL 6, CERVICAL 6- CERVICAL 7 WITH INSTRUMENTATION AND ALLOGRAFT   Norva Karvonen, MD 10/11/2021 6:38 AM

## 2021-10-11 NOTE — Transfer of Care (Signed)
Immediate Anesthesia Transfer of Care Note  Patient: Betty Fox  Procedure(s) Performed: ANTERIOR CERVICAL DECOMPRESSION FUSION CERVICAL 4- CERVICAL 5, CERVICAL 5- CERVICAL 6, CERVICAL 6- CERVICAL 7 WITH INSTRUMENTATION AND ALLOGRAFT (Spine Cervical)  Patient Location: PACU  Anesthesia Type:General  Level of Consciousness: awake and alert   Airway & Oxygen Therapy: Patient Spontanous Breathing  Post-op Assessment: Report given to RN and Post -op Vital signs reviewed and stable  Post vital signs: Reviewed and stable  Last Vitals:  Vitals Value Taken Time  BP 119/68 10/11/21 1103  Temp    Pulse 68 10/11/21 1106  Resp 19 10/11/21 1106  SpO2 99 % 10/11/21 1106  Vitals shown include unvalidated device data.  Last Pain:  Vitals:   10/11/21 0623  TempSrc:   PainSc: 0-No pain      Patients Stated Pain Goal: 0 (40/98/11 9147)  Complications: No notable events documented.

## 2021-10-11 NOTE — Op Note (Signed)
PATIENT NAME: Betty Fox RECORD NO.:   [id number]    DATE OF BIRTH: 02/17/1945   DATE OF PROCEDURE: 10/11/2021                               OPERATIVE REPORT     PREOPERATIVE DIAGNOSES: 1. Right-sided cervical radiculopathy. 2  Cervical myelopathy   3. Spinal stenosis spanning C4-C7.   POSTOPERATIVE DIAGNOSES: 1. Right-sided cervical radiculopathy. 2  Cervical myelopathy   3. Spinal stenosis spanning C4-C7.   PROCEDURE: 1. Anterior cervical decompression and fusion C4/5, C5/6, C6/7. 2. Placement of anterior instrumentation, C4-C7. 3. Insertion of interbody device x 3 (Titan intervertebral spacers). 4. Intraoperative use of fluoroscopy. 5. Use of morselized allograft - ViviGen.   SURGEON:  Phylliss Bob, MD   ASSISTANT:  Pricilla Holm, PA-C.   ANESTHESIA:  General endotracheal anesthesia.   COMPLICATIONS:  None.   DISPOSITION:  Stable.   ESTIMATED BLOOD LOSS:  Minimal.   INDICATIONS FOR SURGERY:  Briefly, Betty Fox is a pleasant 76 y.o. year-old patient, who did present to me with pain in the neck and right arm, in addition to deterioration in balance. The patient's MRI did reveal the findings noted above.  Given the patient's ongoing symptoms, and lack of improvement with appropriate treatment measures, we did discuss proceeding with the procedure noted above.  The patient was fully aware of the risks and limitations of surgery as outlined in my preoperative note.   OPERATIVE DETAILS:  On 10/11/2021, the patient was brought to surgery and general endotracheal anesthesia was administered.  The patient was placed supine on the hospital bed. The neck was gently extended.  All bony prominences were meticulously padded.  The neck was prepped and draped in the usual sterile fashion.  At this point, I did make a left-sided transverse incision.  The platysma was incised.  A Smith-Robinson approach was used and the anterior spine was identified. A self-retaining  retractor was placed.  I then subperiosteally exposed the vertebral bodies from C4-C7.  Of note, there were osteophytes noted anteriorly, which were very extensive.  These were removed using a rondure and a high-speed bur. Caspar pins were then placed into the C6 and C7 vertebral bodies and distraction was applied.  A thorough and complete C6-7 intervertebral diskectomy was performed.  The posterior longitudinal ligament was identified and entered using a nerve hook.  I then used #1 followed by #2 Kerrison to perform a thorough and complete intervertebral diskectomy.  The spinal canal was thoroughly decompressed, as was the right and left neuroforamen.  The endplates were then prepared and the appropriate-sized intervertebral spacer was then packed with ViviGen and tamped into position in the usual fashion.  The lower Caspar pin was then removed and placed into the C5 vertebral body and once again, distraction was applied across the C5-6 intervertebral space.  I then again performed a thorough and complete diskectomy, thoroughly decompressing the spinal canal and bilateral neuroforamena.  After preparing the endplates, the appropriate-sized intervertebral spacer was packed with ViviGen and tamped into position.  The lower Caspar pin was then removed and placed into the C4 vertebral body and once again, distraction was applied across the C4-5 intervertebral space.  I then again performed a thorough and complete diskectomy, thoroughly decompressing the spinal canal and bilateral neuroforamena.  After preparing the endplates, the appropriate-sized intervertebral spacer was packed with ViviGen and tamped into position.  The  Caspar pins then were removed and bone wax was placed in their place.  The appropriate-sized anterior cervical plate was placed over the anterior spine.  14 mm variable angle screws were placed, 2 in each vertebral body from C4-C7 for a total of 8 vertebral body screws.  The  screws were then locked to the plate using the Cam locking mechanism.  I was very pleased with the final fluoroscopic images.  The wound was then irrigated.  The wound was then explored for any undue bleeding and there was no bleeding noted. The wound was then closed in layers using 2-0 Vicryl, followed by 4-0 Monocryl.  Benzoin and Steri-Strips were applied, followed by sterile dressing.  All instrument counts were correct at the termination of the procedure.   Of note, Pricilla Holm, PA-C, was my assistant throughout surgery, and did aid in retraction, suctioning, placement of the hardware, and closure from start to finish.         Phylliss Bob, MD

## 2021-10-12 ENCOUNTER — Encounter (HOSPITAL_COMMUNITY): Payer: Self-pay | Admitting: Orthopedic Surgery

## 2021-10-12 MED FILL — Thrombin For Soln Kit 20000 Unit: CUTANEOUS | Qty: 1 | Status: AC

## 2021-10-13 DIAGNOSIS — Z981 Arthrodesis status: Secondary | ICD-10-CM | POA: Diagnosis not present

## 2021-10-13 DIAGNOSIS — M4802 Spinal stenosis, cervical region: Secondary | ICD-10-CM | POA: Diagnosis not present

## 2021-10-18 NOTE — Discharge Summary (Signed)
Patient ID: Betty Fox MRN: [id number] DOB/AGE: 05/04/1945 76 y.o.  Admit date: 10/11/2021 Discharge date: 10/11/2021  Admission Diagnoses:  Principal Problem:   Myeloradiculopathy   Discharge Diagnoses:  Same  Past Medical History:  Diagnosis Date   Allergic rhinitis    Arthritis    Asthma    Balance problem    Colitis    hx of   Diabetes mellitus without complication (HCC)    Diverticulosis    Dyspepsia    Eczema    GERD (gastroesophageal reflux disease)    Hashimoto's thyroiditis    Headache    History of hiatal hernia    Hyperlipidemia    Hypertension    Hypothyroidism    Kidney stones    Nephrolithiasis    Osteopenia    Pericarditis 1999   Pneumonia    PONV (postoperative nausea and vomiting)    PSVT (paroxysmal supraventricular tachycardia) (HCC)    Renal cyst    Shingles    Sjogren's syndrome (HCC)    Tremors of nervous system    Uveitis    Vitamin D deficiency     Surgeries: Procedure(s): ANTERIOR CERVICAL DECOMPRESSION FUSION CERVICAL 4- CERVICAL 5, CERVICAL 5- CERVICAL 6, CERVICAL 6- CERVICAL 7 WITH INSTRUMENTATION AND ALLOGRAFT on 10/11/2021   Consultants: None  Discharged Condition: Improved  Hospital Course: Betty Fox is an 76 y.o. female who was admitted 10/11/2021 for operative treatment of Myeloradiculopathy. Patient has severe unremitting pain that affects sleep, daily activities, and work/hobbies. After pre-op clearance the patient was taken to the operating room on 10/11/2021 and underwent  Procedure(s): ANTERIOR CERVICAL DECOMPRESSION FUSION CERVICAL 4- CERVICAL 5, CERVICAL 5- CERVICAL 6, CERVICAL 6- CERVICAL 7 WITH INSTRUMENTATION AND ALLOGRAFT.    Patient was given perioperative antibiotics:  Anti-infectives (From admission, onward)    Start     Dose/Rate Route Frequency Ordered Stop   10/11/21 0600  vancomycin (VANCOCIN) IVPB 1000 mg/200 mL premix        1,000 mg 200 mL/hr over 60 Minutes Intravenous To [facility]  10/11/21 0549 10/11/21 0805        Patient was given sequential compression devices, early ambulation to prevent DVT.  Patient benefited maximally from hospital stay and there were no complications.    Recent vital signs: BP 117/65 (BP Location: Left Arm)   Pulse (!) 56   Temp 98.1 F (36.7 C)   Resp 18   Ht 5' 2.5" (1.588 m)   Wt 80.7 kg   SpO2 93%   BMI 32.04 kg/m    Discharge Medications:   Allergies as of 10/11/2021       Reactions   Ampicillin Hives, Swelling   Ace Inhibitors Cough   Calcium Carbonate Antacid Hives   Prinivil [lisinopril] Cough   Elemental Sulfur Itching   Esomeprazole Magnesium    Omeprazole Magnesium Hives   Penicillins Hives   Sulfa Antibiotics    Zoledronic Acid    Osteonecrosis of jaw   Ranitidine Hcl Rash        Medication List     TAKE these medications    albuterol 108 (90 Base) MCG/ACT inhaler Commonly known as: VENTOLIN HFA Inhale 1-2 puffs into the lungs every 6 (six) hours as needed for wheezing or shortness of breath.   amLODipine 10 MG tablet Commonly known as: NORVASC TAKE 1 TABLET BY MOUTH EVERY DAY   Arnuity Ellipta 100 MCG/ACT Aepb Generic drug: Fluticasone Furoate Inhale 2 puffs into the lungs daily.   atenolol  25 MG tablet Commonly known as: TENORMIN TAKE 1 TABLET BY MOUTH EVERY DAY   atorvastatin 10 MG tablet Commonly known as: LIPITOR Take 1 tablet (10 mg total) by mouth 5 days a week   B-12 PO Take 1 tablet by mouth daily.   Dulaglutide 1.5 MG/0.5ML Sopn Inject 1.5 mg into the skin every Friday.   EPINEPHrine 0.3 mg/0.3 mL Soaj injection Commonly known as: EPI-PEN Inject 0.3 mg into the muscle as needed for anaphylaxis.   fexofenadine 180 MG tablet Commonly known as: ALLEGRA Take 180 mg by mouth daily.   fluticasone 50 MCG/ACT nasal spray Commonly known as: FLONASE Place 2 sprays into both nostrils daily.   gabapentin 100 MG capsule Commonly known as: NEURONTIN Take 100 mg by mouth at  bedtime.   hydrochlorothiazide 25 MG tablet Commonly known as: HYDRODIURIL TAKE 1 TABLET BY MOUTH EVERY OTHER DAY AS DIRECTED.CALL [phone] FOR FOLLOW UP APT   HYDROcodone-acetaminophen 5-325 MG tablet Commonly known as: NORCO/VICODIN Take 1 tablet by mouth every 6 (six) hours as needed for moderate pain or severe pain.   levothyroxine 88 MCG tablet Commonly known as: SYNTHROID Take 1 tablet (88 mcg total) by mouth daily before breakfast. What changed: how much to take   methocarbamol 750 MG tablet Commonly known as: ROBAXIN Take 1 tablet (750 mg total) by mouth every 6 (six) hours as needed for muscle spasms.   potassium chloride 10 MEQ tablet Commonly known as: KLOR-CON TAKE 1 TABLET BY MOUTH 2 TIMES DAILY.   sodium chloride 0.65 % Soln nasal spray Commonly known as: OCEAN Place 1 spray into both nostrils as needed for congestion.   triamcinolone ointment 0.1 % Commonly known as: KENALOG Apply 1 Application topically daily as needed (eczema).   Vitamin D 50 MCG (2000 UT) tablet Take 2,000 Units by mouth daily.        Diagnostic Studies: DG Cervical Spine 1 View  Result Date: 10/11/2021 CLINICAL DATA:  Fluoroscopic assistance for cervical spine surgery EXAM: DG CERVICAL SPINE - 1 VIEW COMPARISON:  None Available. FINDINGS: Fluoroscopic images show surgical fusion from C4-C7 levels. Intervertebral disc spaces are noted in place. Large anterior bony spurs seen in upper cervical spine. Fluoroscopy time 28 seconds. Radiation dose 5.13 mGy. IMPRESSION: Fluoroscopic assistance was provided for anterior surgical fusion from C4-C7 levels. Electronically Signed   By: Elmer Picker M.D.   On: 10/11/2021 11:20   DG C-Arm 1-60 Min-No Report  Result Date: 10/11/2021 Fluoroscopy was utilized by the requesting physician.  No radiographic interpretation.   DG C-Arm 1-60 Min-No Report  Result Date: 10/11/2021 Fluoroscopy was utilized by the requesting physician.  No  radiographic interpretation.   DG C-Arm 1-60 Min-No Report  Result Date: 10/11/2021 Fluoroscopy was utilized by the requesting physician.  No radiographic interpretation.    Disposition: Discharge disposition: 01-Home or Self Care       Discharge Instructions     Discharge patient   Complete by: As directed    Discharge disposition: 01-Home or Self Care   Discharge patient date: 10/11/2021      C4-7 ACDF, d/c same day  -Scripts for pain sent to pharmacy electronically  -D/C instructions sheet printed and in chart -D/C today  -F/U in office 2 weeks   Signed: Lennie Muckle Jhovani Griswold 10/18/2021, 11:29 AM

## 2021-10-25 DIAGNOSIS — M4322 Fusion of spine, cervical region: Secondary | ICD-10-CM | POA: Diagnosis not present

## 2021-10-25 DIAGNOSIS — Z9889 Other specified postprocedural states: Secondary | ICD-10-CM | POA: Diagnosis not present

## 2021-11-07 DIAGNOSIS — E039 Hypothyroidism, unspecified: Secondary | ICD-10-CM | POA: Diagnosis not present

## 2021-11-24 DIAGNOSIS — M4322 Fusion of spine, cervical region: Secondary | ICD-10-CM | POA: Diagnosis not present

## 2021-12-13 DIAGNOSIS — N3946 Mixed incontinence: Secondary | ICD-10-CM | POA: Diagnosis not present

## 2021-12-13 DIAGNOSIS — N281 Cyst of kidney, acquired: Secondary | ICD-10-CM | POA: Diagnosis not present

## 2021-12-18 DIAGNOSIS — M5412 Radiculopathy, cervical region: Secondary | ICD-10-CM | POA: Diagnosis not present

## 2022-01-18 DIAGNOSIS — Z789 Other specified health status: Secondary | ICD-10-CM | POA: Diagnosis not present

## 2022-01-18 DIAGNOSIS — Z9889 Other specified postprocedural states: Secondary | ICD-10-CM | POA: Diagnosis not present

## 2022-01-18 DIAGNOSIS — M40202 Unspecified kyphosis, cervical region: Secondary | ICD-10-CM | POA: Diagnosis not present

## 2022-01-18 DIAGNOSIS — M6281 Muscle weakness (generalized): Secondary | ICD-10-CM | POA: Diagnosis not present

## 2022-01-24 ENCOUNTER — Other Ambulatory Visit: Payer: Self-pay | Admitting: Interventional Cardiology

## 2022-01-29 DIAGNOSIS — M542 Cervicalgia: Secondary | ICD-10-CM | POA: Diagnosis not present

## 2022-02-01 DIAGNOSIS — M810 Age-related osteoporosis without current pathological fracture: Secondary | ICD-10-CM | POA: Diagnosis not present

## 2022-02-01 DIAGNOSIS — R5383 Other fatigue: Secondary | ICD-10-CM | POA: Diagnosis not present

## 2022-02-01 DIAGNOSIS — E559 Vitamin D deficiency, unspecified: Secondary | ICD-10-CM | POA: Diagnosis not present

## 2022-02-05 DIAGNOSIS — M40202 Unspecified kyphosis, cervical region: Secondary | ICD-10-CM | POA: Diagnosis not present

## 2022-02-05 DIAGNOSIS — Z789 Other specified health status: Secondary | ICD-10-CM | POA: Diagnosis not present

## 2022-02-05 DIAGNOSIS — M6281 Muscle weakness (generalized): Secondary | ICD-10-CM | POA: Diagnosis not present

## 2022-02-05 DIAGNOSIS — Z9889 Other specified postprocedural states: Secondary | ICD-10-CM | POA: Diagnosis not present

## 2022-02-15 DIAGNOSIS — M6281 Muscle weakness (generalized): Secondary | ICD-10-CM | POA: Diagnosis not present

## 2022-02-15 DIAGNOSIS — M40202 Unspecified kyphosis, cervical region: Secondary | ICD-10-CM | POA: Diagnosis not present

## 2022-02-15 DIAGNOSIS — Z9889 Other specified postprocedural states: Secondary | ICD-10-CM | POA: Diagnosis not present

## 2022-02-15 DIAGNOSIS — Z789 Other specified health status: Secondary | ICD-10-CM | POA: Diagnosis not present

## 2022-02-22 DIAGNOSIS — M6281 Muscle weakness (generalized): Secondary | ICD-10-CM | POA: Diagnosis not present

## 2022-02-22 DIAGNOSIS — Z9889 Other specified postprocedural states: Secondary | ICD-10-CM | POA: Diagnosis not present

## 2022-03-01 DIAGNOSIS — M6281 Muscle weakness (generalized): Secondary | ICD-10-CM | POA: Diagnosis not present

## 2022-03-01 DIAGNOSIS — Z789 Other specified health status: Secondary | ICD-10-CM | POA: Diagnosis not present

## 2022-03-01 DIAGNOSIS — M810 Age-related osteoporosis without current pathological fracture: Secondary | ICD-10-CM | POA: Diagnosis not present

## 2022-03-01 DIAGNOSIS — M40202 Unspecified kyphosis, cervical region: Secondary | ICD-10-CM | POA: Diagnosis not present

## 2022-03-01 DIAGNOSIS — Z9889 Other specified postprocedural states: Secondary | ICD-10-CM | POA: Diagnosis not present

## 2022-03-05 DIAGNOSIS — E119 Type 2 diabetes mellitus without complications: Secondary | ICD-10-CM | POA: Diagnosis not present

## 2022-03-05 DIAGNOSIS — M810 Age-related osteoporosis without current pathological fracture: Secondary | ICD-10-CM | POA: Diagnosis not present

## 2022-03-05 DIAGNOSIS — I10 Essential (primary) hypertension: Secondary | ICD-10-CM | POA: Diagnosis not present

## 2022-03-05 DIAGNOSIS — Z8639 Personal history of other endocrine, nutritional and metabolic disease: Secondary | ICD-10-CM | POA: Diagnosis not present

## 2022-03-05 DIAGNOSIS — Z87898 Personal history of other specified conditions: Secondary | ICD-10-CM | POA: Diagnosis not present

## 2022-03-05 DIAGNOSIS — E039 Hypothyroidism, unspecified: Secondary | ICD-10-CM | POA: Diagnosis not present

## 2022-03-05 DIAGNOSIS — E063 Autoimmune thyroiditis: Secondary | ICD-10-CM | POA: Diagnosis not present

## 2022-03-08 DIAGNOSIS — M6281 Muscle weakness (generalized): Secondary | ICD-10-CM | POA: Diagnosis not present

## 2022-03-08 DIAGNOSIS — Z789 Other specified health status: Secondary | ICD-10-CM | POA: Diagnosis not present

## 2022-03-08 DIAGNOSIS — M40202 Unspecified kyphosis, cervical region: Secondary | ICD-10-CM | POA: Diagnosis not present

## 2022-03-08 DIAGNOSIS — Z9889 Other specified postprocedural states: Secondary | ICD-10-CM | POA: Diagnosis not present

## 2022-03-15 DIAGNOSIS — M6281 Muscle weakness (generalized): Secondary | ICD-10-CM | POA: Diagnosis not present

## 2022-03-15 DIAGNOSIS — Z9889 Other specified postprocedural states: Secondary | ICD-10-CM | POA: Diagnosis not present

## 2022-03-15 DIAGNOSIS — Z789 Other specified health status: Secondary | ICD-10-CM | POA: Diagnosis not present

## 2022-03-15 DIAGNOSIS — M40202 Unspecified kyphosis, cervical region: Secondary | ICD-10-CM | POA: Diagnosis not present

## 2022-03-19 ENCOUNTER — Other Ambulatory Visit: Payer: Self-pay | Admitting: Interventional Cardiology

## 2022-03-25 ENCOUNTER — Other Ambulatory Visit: Payer: Self-pay | Admitting: Interventional Cardiology

## 2022-03-29 DIAGNOSIS — M810 Age-related osteoporosis without current pathological fracture: Secondary | ICD-10-CM | POA: Diagnosis not present

## 2022-04-09 DIAGNOSIS — M1991 Primary osteoarthritis, unspecified site: Secondary | ICD-10-CM | POA: Diagnosis not present

## 2022-04-09 DIAGNOSIS — Z6834 Body mass index (BMI) 34.0-34.9, adult: Secondary | ICD-10-CM | POA: Diagnosis not present

## 2022-04-09 DIAGNOSIS — R768 Other specified abnormal immunological findings in serum: Secondary | ICD-10-CM | POA: Diagnosis not present

## 2022-04-09 DIAGNOSIS — H209 Unspecified iridocyclitis: Secondary | ICD-10-CM | POA: Diagnosis not present

## 2022-04-09 DIAGNOSIS — M810 Age-related osteoporosis without current pathological fracture: Secondary | ICD-10-CM | POA: Diagnosis not present

## 2022-04-09 DIAGNOSIS — M3501 Sicca syndrome with keratoconjunctivitis: Secondary | ICD-10-CM | POA: Diagnosis not present

## 2022-04-09 DIAGNOSIS — E669 Obesity, unspecified: Secondary | ICD-10-CM | POA: Diagnosis not present

## 2022-04-10 DIAGNOSIS — L280 Lichen simplex chronicus: Secondary | ICD-10-CM | POA: Diagnosis not present

## 2022-04-10 DIAGNOSIS — B078 Other viral warts: Secondary | ICD-10-CM | POA: Diagnosis not present

## 2022-05-01 DIAGNOSIS — R5383 Other fatigue: Secondary | ICD-10-CM | POA: Diagnosis not present

## 2022-05-01 DIAGNOSIS — M810 Age-related osteoporosis without current pathological fracture: Secondary | ICD-10-CM | POA: Diagnosis not present

## 2022-05-23 DIAGNOSIS — Z961 Presence of intraocular lens: Secondary | ICD-10-CM | POA: Diagnosis not present

## 2022-05-23 DIAGNOSIS — E119 Type 2 diabetes mellitus without complications: Secondary | ICD-10-CM | POA: Diagnosis not present

## 2022-05-23 DIAGNOSIS — H524 Presbyopia: Secondary | ICD-10-CM | POA: Diagnosis not present

## 2022-05-23 DIAGNOSIS — H43813 Vitreous degeneration, bilateral: Secondary | ICD-10-CM | POA: Diagnosis not present

## 2022-05-30 DIAGNOSIS — J3089 Other allergic rhinitis: Secondary | ICD-10-CM | POA: Diagnosis not present

## 2022-05-30 DIAGNOSIS — Z91011 Allergy to milk products: Secondary | ICD-10-CM | POA: Diagnosis not present

## 2022-05-30 DIAGNOSIS — K219 Gastro-esophageal reflux disease without esophagitis: Secondary | ICD-10-CM | POA: Diagnosis not present

## 2022-05-30 DIAGNOSIS — J4530 Mild persistent asthma, uncomplicated: Secondary | ICD-10-CM | POA: Diagnosis not present

## 2022-07-03 DIAGNOSIS — M1990 Unspecified osteoarthritis, unspecified site: Secondary | ICD-10-CM | POA: Diagnosis not present

## 2022-07-03 DIAGNOSIS — E785 Hyperlipidemia, unspecified: Secondary | ICD-10-CM | POA: Diagnosis not present

## 2022-07-03 DIAGNOSIS — E669 Obesity, unspecified: Secondary | ICD-10-CM | POA: Diagnosis not present

## 2022-07-03 DIAGNOSIS — E039 Hypothyroidism, unspecified: Secondary | ICD-10-CM | POA: Diagnosis not present

## 2022-07-03 DIAGNOSIS — I471 Supraventricular tachycardia, unspecified: Secondary | ICD-10-CM | POA: Diagnosis not present

## 2022-07-03 DIAGNOSIS — G250 Essential tremor: Secondary | ICD-10-CM | POA: Diagnosis not present

## 2022-07-03 DIAGNOSIS — I129 Hypertensive chronic kidney disease with stage 1 through stage 4 chronic kidney disease, or unspecified chronic kidney disease: Secondary | ICD-10-CM | POA: Diagnosis not present

## 2022-07-03 DIAGNOSIS — M810 Age-related osteoporosis without current pathological fracture: Secondary | ICD-10-CM | POA: Diagnosis not present

## 2022-07-03 DIAGNOSIS — Z79899 Other long term (current) drug therapy: Secondary | ICD-10-CM | POA: Diagnosis not present

## 2022-07-07 ENCOUNTER — Other Ambulatory Visit: Payer: Self-pay | Admitting: Interventional Cardiology

## 2022-08-06 ENCOUNTER — Other Ambulatory Visit: Payer: Self-pay | Admitting: Interventional Cardiology

## 2022-08-13 ENCOUNTER — Encounter: Payer: Self-pay | Admitting: Gastroenterology

## 2022-08-14 ENCOUNTER — Other Ambulatory Visit: Payer: Self-pay | Admitting: Interventional Cardiology

## 2022-08-27 ENCOUNTER — Other Ambulatory Visit: Payer: Self-pay | Admitting: Interventional Cardiology

## 2022-08-30 DIAGNOSIS — E669 Obesity, unspecified: Secondary | ICD-10-CM | POA: Diagnosis not present

## 2022-08-30 DIAGNOSIS — Z0000 Encounter for general adult medical examination without abnormal findings: Secondary | ICD-10-CM | POA: Diagnosis not present

## 2022-08-30 DIAGNOSIS — Z1389 Encounter for screening for other disorder: Secondary | ICD-10-CM | POA: Diagnosis not present

## 2022-09-11 DIAGNOSIS — Z87898 Personal history of other specified conditions: Secondary | ICD-10-CM | POA: Diagnosis not present

## 2022-09-11 DIAGNOSIS — E039 Hypothyroidism, unspecified: Secondary | ICD-10-CM | POA: Diagnosis not present

## 2022-09-11 DIAGNOSIS — E1165 Type 2 diabetes mellitus with hyperglycemia: Secondary | ICD-10-CM | POA: Diagnosis not present

## 2022-09-11 DIAGNOSIS — I10 Essential (primary) hypertension: Secondary | ICD-10-CM | POA: Diagnosis not present

## 2022-09-11 DIAGNOSIS — E063 Autoimmune thyroiditis: Secondary | ICD-10-CM | POA: Diagnosis not present

## 2022-09-11 DIAGNOSIS — M810 Age-related osteoporosis without current pathological fracture: Secondary | ICD-10-CM | POA: Diagnosis not present

## 2022-09-11 DIAGNOSIS — Z8639 Personal history of other endocrine, nutritional and metabolic disease: Secondary | ICD-10-CM | POA: Diagnosis not present

## 2022-09-19 DIAGNOSIS — Z23 Encounter for immunization: Secondary | ICD-10-CM | POA: Diagnosis not present

## 2022-09-19 DIAGNOSIS — E1165 Type 2 diabetes mellitus with hyperglycemia: Secondary | ICD-10-CM | POA: Diagnosis not present

## 2022-09-19 DIAGNOSIS — M2550 Pain in unspecified joint: Secondary | ICD-10-CM | POA: Diagnosis not present

## 2022-09-19 DIAGNOSIS — J4520 Mild intermittent asthma, uncomplicated: Secondary | ICD-10-CM | POA: Diagnosis not present

## 2022-09-19 DIAGNOSIS — M810 Age-related osteoporosis without current pathological fracture: Secondary | ICD-10-CM | POA: Diagnosis not present

## 2022-09-19 DIAGNOSIS — M3501 Sicca syndrome with keratoconjunctivitis: Secondary | ICD-10-CM | POA: Diagnosis not present

## 2022-09-28 DIAGNOSIS — L501 Idiopathic urticaria: Secondary | ICD-10-CM | POA: Diagnosis not present

## 2022-09-28 DIAGNOSIS — D239 Other benign neoplasm of skin, unspecified: Secondary | ICD-10-CM | POA: Diagnosis not present

## 2022-09-28 DIAGNOSIS — L821 Other seborrheic keratosis: Secondary | ICD-10-CM | POA: Diagnosis not present

## 2022-09-28 DIAGNOSIS — L2084 Intrinsic (allergic) eczema: Secondary | ICD-10-CM | POA: Diagnosis not present

## 2022-09-28 DIAGNOSIS — D229 Melanocytic nevi, unspecified: Secondary | ICD-10-CM | POA: Diagnosis not present

## 2022-10-08 ENCOUNTER — Encounter: Payer: Self-pay | Admitting: Interventional Cardiology

## 2022-10-08 ENCOUNTER — Ambulatory Visit: Payer: Medicare PPO | Attending: Interventional Cardiology | Admitting: Interventional Cardiology

## 2022-10-08 VITALS — BP 154/62 | HR 56 | Ht 63.5 in | Wt 195.0 lb

## 2022-10-08 DIAGNOSIS — E7800 Pure hypercholesterolemia, unspecified: Secondary | ICD-10-CM

## 2022-10-08 DIAGNOSIS — I119 Hypertensive heart disease without heart failure: Secondary | ICD-10-CM | POA: Diagnosis not present

## 2022-10-08 DIAGNOSIS — I471 Supraventricular tachycardia, unspecified: Secondary | ICD-10-CM

## 2022-10-08 DIAGNOSIS — E119 Type 2 diabetes mellitus without complications: Secondary | ICD-10-CM

## 2022-10-08 NOTE — Patient Instructions (Signed)
Medication Instructions:  Your physician recommends that you continue on your current medications as directed. Please refer to the Current Medication list given to you today.  *If you need a refill on your cardiac medications before your next appointment, please call your pharmacy*   Lab Work: none If you have labs (blood work) drawn today and your tests are completely normal, you will receive your results only by: MyChart Message (if you have MyChart) OR A paper copy in the mail If you have any lab test that is abnormal or we need to change your treatment, we will call you to review the results.   Testing/Procedures: none   Follow-Up: At [facility], you and your health needs are our priority.  As part of our continuing mission to provide you with exceptional heart care, we have created designated Provider Care Teams.  These Care Teams include your primary Cardiologist (physician) and Advanced Practice Providers (APPs -  Physician Assistants and Nurse Practitioners) who all work together to provide you with the care you need, when you need it.  We recommend signing up for the patient portal called "MyChart".  Sign up information is provided on this After Visit Summary.  MyChart is used to connect with patients for Virtual Visits (Telemedicine).  Patients are able to view lab/test results, encounter notes, upcoming appointments, etc.  Non-urgent messages can be sent to your provider as well.   To learn more about what you can do with MyChart, go to [email or web address].    Your next appointment:   6 month(s)  Provider:   Chilton Si, MD    Other Instructions

## 2022-10-08 NOTE — Progress Notes (Signed)
Cardiology Office Note   Date:  10/08/2022   ID:  Betty, Fox 1945-06-01, MRN [id number]  PCP:  Sigmund Hazel, MD    No chief complaint on file.  SVT  Wt Readings from Last 3 Encounters:  10/08/22 195 lb (88.5 kg)  10/11/21 178 lb (80.7 kg)  10/03/21 180 lb 6.4 oz (81.8 kg)       History of Present Illness: Betty Fox is a 77 y.o. female  retired Runner, broadcasting/film/video who has a history of essential hypertension, hypothyroidism, dyslipidemia,and asthma. She was followed by Dr. Patty Sermons in the past.  About 10 years ago she had pericarditis. She had not had any recurrent symptoms of pericarditis. She has a history of allergy to ACE inhibitors which cause a cough. He's had a past history of supraventricular tachycardia which she can abort by doing a Valsalva maneuver. He has a history of hypothyroidism followed by endocrinology.  She has been diagnosed as Hashimoto's thyroiditis.  Dr. Sharl Ma is her endocrinologist.  She has a history of mild diabetes. She does not have any history of ischemic heart disease.   In 2015, She lost 30 pounds.  She was diagnosed with Sjogrens syndrome.    In 2020, SVT was well controlled.   She had been on amlodipine in the past but this was changed her med to valsartan due to the possibility of gingival hyperplasia in 2/21.  Headaches got worse since that time.  She also had her COVID shot at the same time.   In early 2021, she has had some more headaches- starting when the BP med was changed.  She will be seeing Dr. Marjory Lies regarding the headaches.  She has migraines with associated visual changes.  Her shaking feeling has also gotten worse.  She had an MRI.  No significant issues per her report.     In 2021, She had an episode of dizziness when she got up to answer the phone. She called EMS and was told she may have vertigo. She did not end up going to the hospital because all of her vital signs were normal.    She has essential tremor.    Her husband  requires some assistance and this is a source of stress.  He had a stroke which has affected his memory.  Foot surgery for heel spur in 2020.    In 2023, She has had some neck pain from a bone spur. She was taking prednisone. Did have mild palpitations with the prednisone.   Had an episode two weeks ago where her HR (122 bpm) was fast and she felt "scared."    No recurrent palpitations.   Not walking regularly. No longer goes to the [facility].  Caring for her husband takes a lot of her time.   Denies : Chest pain. Nitroglycerin use. Orthopnea. Paroxysmal nocturnal dyspnea.  Syncope.     Past Medical History:  Diagnosis Date   Allergic rhinitis    Arthritis    Asthma    Balance problem    Colitis    hx of   Diabetes mellitus without complication (HCC)    Diverticulosis    Dyspepsia    Eczema    GERD (gastroesophageal reflux disease)    Hashimoto's thyroiditis    Headache    History of hiatal hernia    Hyperlipidemia    Hypertension    Hypothyroidism    Kidney stones    Nephrolithiasis    Osteopenia    Pericarditis 1999  Pneumonia    PONV (postoperative nausea and vomiting)    PSVT (paroxysmal supraventricular tachycardia)    Renal cyst    Shingles    Sjogren's syndrome (HCC)    Tremors of nervous system    Uveitis    Vitamin D deficiency     Past Surgical History:  Procedure Laterality Date   ANTERIOR CERVICAL DECOMPRESSION/DISCECTOMY FUSION 4 LEVELS N/A 10/11/2021   Procedure: ANTERIOR CERVICAL DECOMPRESSION FUSION CERVICAL 4- CERVICAL 5, CERVICAL 5- CERVICAL 6, CERVICAL 6- CERVICAL 7 WITH INSTRUMENTATION AND ALLOGRAFT;  Surgeon: Estill Bamberg, MD;  Location: [facility] OR;  Service: Orthopedics;  Laterality: N/A;   BLADDER REPAIR     BREAST REDUCTION SURGERY Bilateral    DILATION AND CURETTAGE OF UTERUS     LAPAROSCOPIC ENDOMETRIOSIS FULGURATION     appendix removed as well per pt   laparotomy with oopherectomy Left 01/15/1977   MOUTH SURGERY  06/16/2014   TUBAL  LIGATION     US ECHOCARDIOGRAPHY  07/27/2009   EF 55-60%     Current Outpatient Medications  Medication Sig Dispense Refill   albuterol (VENTOLIN HFA) 108 (90 Base) MCG/ACT inhaler Inhale 1-2 puffs into the lungs every 6 (six) hours as needed for wheezing or shortness of breath.     amLODipine (NORVASC) 10 MG tablet TAKE 1 TABLET BY MOUTH EVERY DAY 90 tablet 0   ARNUITY ELLIPTA 100 MCG/ACT AEPB Inhale 2 puffs into the lungs daily.  3   atenolol (TENORMIN) 25 MG tablet TAKE 1 TABLET BY MOUTH EVERY DAY 90 tablet 0   atorvastatin (LIPITOR) 10 MG tablet TAKE 1 TABLET (10 MG TOTAL) BY MOUTH 5 DAYS A WEEK 75 tablet 0   betamethasone dipropionate (DIPROLENE) 0.05 % ointment Apply topically.     Cholecalciferol (VITAMIN D) 2000 UNITS tablet Take 2,000 Units by mouth daily.     Continuous Glucose Receiver (FREESTYLE LIBRE 3 READER) DEVI on insulin change sensor every 14 days for 90 days     Continuous Glucose Sensor (FREESTYLE LIBRE 3 SENSOR) MISC on insulin, change sensor every 14 days     Cyanocobalamin (B-12 PO) Take 1 tablet by mouth daily.     Dulaglutide 1.5 MG/0.5ML SOPN Inject 1.5 mg into the skin every Friday.     EPINEPHrine 0.3 mg/0.3 mL IJ SOAJ injection Inject 0.3 mg into the muscle as needed for anaphylaxis.     fexofenadine (ALLEGRA) 180 MG tablet Take 180 mg by mouth daily.     fluticasone (FLONASE) 50 MCG/ACT nasal spray Place 2 sprays into both nostrils daily.  5   FORTEO 600 MCG/2.4ML SOPN Inject into the skin.     hydrochlorothiazide (HYDRODIURIL) 25 MG tablet TAKE 1 TABLET BY MOUTH EVERY OTHER DAY AS DIRECTED. 45 tablet 1   HYDROcodone-acetaminophen (NORCO/VICODIN) 5-325 MG tablet Take 1 tablet by mouth every 6 (six) hours as needed for moderate pain or severe pain. 20 tablet 0   levothyroxine (SYNTHROID) 88 MCG tablet Take 1 tablet (88 mcg total) by mouth daily before breakfast. 1 tablet 0   methocarbamol (ROBAXIN) 750 MG tablet Take 1 tablet (750 mg total) by mouth every 6  (six) hours as needed for muscle spasms. 60 tablet 0   potassium chloride (KLOR-CON) 10 MEQ tablet TAKE 1 TABLET BY MOUTH TWICE A DAY 180 tablet 0   sodium chloride (OCEAN) 0.65 % SOLN nasal spray Place 1 spray into both nostrils as needed for congestion.     triamcinolone ointment (KENALOG) 0.1 % Apply 1 Application topically daily  as needed (eczema).     No current facility-administered medications for this visit.    Allergies:   Ampicillin, Ace inhibitors, Calcium carbonate antacid, Prinivil [lisinopril], Elemental sulfur, Esomeprazole magnesium, Omeprazole magnesium, Penicillins, Sulfa antibiotics, Zoledronic acid, and Ranitidine hcl    Social History:  The patient  reports that she has never smoked. She has never used smokeless tobacco. She reports current alcohol use. She reports that she does not use drugs.   Family History:  The patient's family history is not on file. She was adopted.    ROS:  Please see the history of present illness.   Otherwise, review of systems are positive for palpitations.   All other systems are reviewed and negative.    PHYSICAL EXAM: VS:  BP (!) 154/62   Pulse (!) 56   Ht 5' 3.5" (1.613 m)   Wt 195 lb (88.5 kg)   SpO2 96%   BMI 34.00 kg/m  , BMI Body mass index is 34 kg/m. GEN: Well nourished, well developed, in no acute distress HEENT: normal Neck: no JVD, carotid bruits, or masses Cardiac: RRR; no murmurs, rubs, or gallops,no edema  Respiratory:  clear to auscultation bilaterally, normal work of breathing GI: soft, nontender, nondistended, + BS MS: no deformity or atrophy Skin: warm and dry, no rash Neuro:  Strength and sensation are intact; left hand tremor Psych: euthymic mood, full affect   EKG:   The ekg ordered today demonstrates sinus bradycardia with PAC   Recent Labs: No results found for requested labs within last 365 days.   Lipid Panel    Component Value Date/Time   CHOL 186 05/25/2020 1015   TRIG 103 05/25/2020 1015    HDL 45 05/25/2020 1015   CHOLHDL 4.1 05/25/2020 1015   LDLCALC 122 (H) 05/25/2020 1015     Other studies Reviewed: Additional studies/ records that were reviewed today with results demonstrating: LDL 104, HDL 51, TG 98 in 8295.   ASSESSMENT AND PLAN:  SVT: Has been on atenolol for some time to control symptoms.  No syncope.  Continue atenolol.  Diabetes mellitus: A1c 8.3- increased of late.  Whole food, plant-based diet.  High-fiber diet.  Avoid processed foods.  Try to increase activity.  Se will f/u Dr. Sharl Ma. Hypertensive heart disease: Low-salt diet.  Avoid excessive salt.  Eating more salt lately.  Higher reading today.  Continue to monitor at home.  Hyperlipidemia: Tolerated 5 days a week atorvastatin.  LDL 104.  Neck pain with bone spur: Had surgery.     Current medicines are reviewed at length with the patient today.  The patient concerns regarding her medicines were addressed.  The following changes have been made:  No change  Labs/ tests ordered today include:   Orders Placed This Encounter  Procedures   EKG 12-Lead    Recommend 150 minutes/week of aerobic exercise Low fat, low carb, high fiber diet recommended  Disposition:   FU in 1 year with Dr. Duke Salvia   Signed, Lance Muss, MD  10/08/2022 11:04 AM    [facility] [address] [place], [place], [place]  [address] Phone: [phone]; Fax: [phone]

## 2022-10-18 DIAGNOSIS — K5900 Constipation, unspecified: Secondary | ICD-10-CM | POA: Diagnosis not present

## 2022-10-18 DIAGNOSIS — M810 Age-related osteoporosis without current pathological fracture: Secondary | ICD-10-CM | POA: Diagnosis not present

## 2022-10-18 DIAGNOSIS — Z01419 Encounter for gynecological examination (general) (routine) without abnormal findings: Secondary | ICD-10-CM | POA: Diagnosis not present

## 2022-10-18 DIAGNOSIS — Z6835 Body mass index (BMI) 35.0-35.9, adult: Secondary | ICD-10-CM | POA: Diagnosis not present

## 2022-10-18 DIAGNOSIS — Z1231 Encounter for screening mammogram for malignant neoplasm of breast: Secondary | ICD-10-CM | POA: Diagnosis not present

## 2022-11-01 DIAGNOSIS — E559 Vitamin D deficiency, unspecified: Secondary | ICD-10-CM | POA: Diagnosis not present

## 2022-11-01 DIAGNOSIS — R5383 Other fatigue: Secondary | ICD-10-CM | POA: Diagnosis not present

## 2022-11-01 DIAGNOSIS — M810 Age-related osteoporosis without current pathological fracture: Secondary | ICD-10-CM | POA: Diagnosis not present

## 2022-11-11 ENCOUNTER — Other Ambulatory Visit: Payer: Self-pay | Admitting: Interventional Cardiology

## 2022-11-15 ENCOUNTER — Other Ambulatory Visit: Payer: Self-pay | Admitting: Interventional Cardiology

## 2022-12-08 ENCOUNTER — Other Ambulatory Visit: Payer: Self-pay | Admitting: Interventional Cardiology

## 2022-12-11 DIAGNOSIS — Z87898 Personal history of other specified conditions: Secondary | ICD-10-CM | POA: Diagnosis not present

## 2022-12-11 DIAGNOSIS — E1165 Type 2 diabetes mellitus with hyperglycemia: Secondary | ICD-10-CM | POA: Diagnosis not present

## 2022-12-11 DIAGNOSIS — I10 Essential (primary) hypertension: Secondary | ICD-10-CM | POA: Diagnosis not present

## 2022-12-11 DIAGNOSIS — M810 Age-related osteoporosis without current pathological fracture: Secondary | ICD-10-CM | POA: Diagnosis not present

## 2022-12-11 DIAGNOSIS — E039 Hypothyroidism, unspecified: Secondary | ICD-10-CM | POA: Diagnosis not present

## 2022-12-11 DIAGNOSIS — E063 Autoimmune thyroiditis: Secondary | ICD-10-CM | POA: Diagnosis not present

## 2022-12-11 DIAGNOSIS — Z8639 Personal history of other endocrine, nutritional and metabolic disease: Secondary | ICD-10-CM | POA: Diagnosis not present

## 2022-12-12 DIAGNOSIS — N281 Cyst of kidney, acquired: Secondary | ICD-10-CM | POA: Diagnosis not present

## 2022-12-12 DIAGNOSIS — N3946 Mixed incontinence: Secondary | ICD-10-CM | POA: Diagnosis not present

## 2022-12-17 ENCOUNTER — Encounter: Payer: Self-pay | Admitting: Gastroenterology

## 2022-12-17 ENCOUNTER — Ambulatory Visit: Payer: Medicare PPO | Admitting: Gastroenterology

## 2022-12-17 VITALS — BP 130/80 | HR 57 | Ht 63.0 in | Wt 200.0 lb

## 2022-12-17 DIAGNOSIS — K5730 Diverticulosis of large intestine without perforation or abscess without bleeding: Secondary | ICD-10-CM

## 2022-12-17 DIAGNOSIS — R194 Change in bowel habit: Secondary | ICD-10-CM | POA: Diagnosis not present

## 2022-12-17 DIAGNOSIS — R1084 Generalized abdominal pain: Secondary | ICD-10-CM

## 2022-12-17 DIAGNOSIS — K582 Mixed irritable bowel syndrome: Secondary | ICD-10-CM

## 2022-12-17 DIAGNOSIS — K648 Other hemorrhoids: Secondary | ICD-10-CM | POA: Diagnosis not present

## 2022-12-17 DIAGNOSIS — M3500 Sicca syndrome, unspecified: Secondary | ICD-10-CM

## 2022-12-17 DIAGNOSIS — K641 Second degree hemorrhoids: Secondary | ICD-10-CM

## 2022-12-17 DIAGNOSIS — K5904 Chronic idiopathic constipation: Secondary | ICD-10-CM

## 2022-12-17 NOTE — Patient Instructions (Addendum)
VISIT SUMMARY:  Betty Fox, during your visit, we discussed your irregular bowel movements, symptoms of Sjogren's syndrome, itching near the rectal area, upper abdominal pain, and recent discomfort from overeating. We have developed a plan to address these issues and will follow up in a few months to assess your progress.  YOUR PLAN:  -DIVERTICULOSIS WITH CONSTIPATION: Diverticulosis is a condition where small pouches form in the colon, which can lead to constipation and irregular bowel movements. To help manage this, you should start taking Colace 1 tablet at bedtime daily and MiraLAX 1 cap full every morning. Additionally, perform a bowel purge with MiraLAX as per the instructions provided.  -INTERNAL HEMORRHOIDS: Hemorrhoids are swollen veins in the lower rectum that can cause itching and discomfort. Your symptoms are likely exacerbated by constipation and straining. No specific treatment is needed at this time, and we expect improvement as your constipation resolves.  -SJOGREN'S SYNDROME: Sjogren's syndrome is an autoimmune disorder that causes dryness in various parts of the body, including the nose, eyes, and throat. Continue to manage your symptoms by staying well-hydrated and drinking plenty of water.  INSTRUCTIONS:  Follow up in a few months to assess improvement in bowel habits and related symptoms.  Dr Lavon Paganini recommends that you complete a bowel purge (to clean out your bowels). Please do the following: Purchase a bottle of Miralax over the counter as well as a box of 5 mg dulcolax tablets. Take 4 dulcolax tablets. Wait 1 hour. You will then drink 6-8 capfuls of Miralax mixed in an adequate amount of water/juice/gatorade (you may choose which of these liquids to drink) over the next 2-3 hours. You should expect results within 1 to 6 hours after completing the bowel purge.  I appreciate the  opportunity to care for you  Thank You   Marsa Aris , MD

## 2022-12-17 NOTE — Progress Notes (Signed)
Betty Fox    [id number]    January 11, 1946  Primary Care Physician:Miller, Misty Stanley, MD  Referring Physician: Sigmund Hazel, MD [address] [place],  [place] [address]   Chief complaint: Constipation alternating with diarrhea  Discussed the use of AI scribe software for clinical note transcription with the patient, who gave verbal consent to proceed.  History of Present Illness   Betty Fox, a very pleasant 54yr F a patient with a history of diverticulosis and Sjogren's syndrome, presents with complaints of irregular bowel movements, ranging from constipation to diarrhea. She reports having a bowel movement only once a week, followed by multiple bowel movements in a single day. She also experiences bloating and discomfort when constipated, which is relieved by taking a medication starting with 'C', possibly Colace, on an as-needed basis.  She also reports symptoms of Sjogren's syndrome, including dryness of the nose and eyes, and a burning sensation in the throat. She manages these symptoms by drinking a lot of water, which she carries with her at all times.  She also reports an itching sensation near the rectal opening, which she suspects might be due to hemorrhoids. The itching has been intermittent and has resolved on its own in the past.  She also reports a new onset of pain in the upper abdomen, which she is unsure if it is related to her diabetes or a side effect from a medication she is taking for bone health.  Lastly, she mentions overeating during Thanksgiving, which resulted in discomfort and acid reflux. She usually manages portion sizes to avoid such incidents.     Colonoscopy 10/23/2017 - One 2 mm polyp in the transverse colon, removed with a cold biopsy forceps. Resected and retrieved. - Severe diverticulosis in the sigmoid colon and in the descending colon. There was narrowing of the colon in association with the diverticular opening. Peri- diverticular erythema  was seen. There was evidence of an impacted diverticulum. - Non- bleeding internal hemorrhoids.  Colonoscopy August 20, 2014 by Dr. Arlyce Dice with removal of 12 mm polyp from descending colon, persistent oozing at polypectomy site, hemostasis achieved with placement of clips at sigmoid diverticulosis and small internal hemorrhoids.   EGD February 11, 2013 showed irregular Z line and esophagitis.  Esophageal biopsies negative for Barrett's esophagus and gastric biopsies negative for intestinal metaplasia, dysplasia or H. pylori.   Other medical history significant for hypertension, Hashimoto's thyroiditis, Sjogren's disease and supraventricular tachycardia   Outpatient Encounter Medications as of 12/17/2022  Medication Sig   albuterol (VENTOLIN HFA) 108 (90 Base) MCG/ACT inhaler Inhale 1-2 puffs into the lungs every 6 (six) hours as needed for wheezing or shortness of breath.   amLODipine (NORVASC) 10 MG tablet TAKE 1 TABLET BY MOUTH EVERY DAY   ARNUITY ELLIPTA 100 MCG/ACT AEPB Inhale 2 puffs into the lungs daily.   atenolol (TENORMIN) 25 MG tablet TAKE 1 TABLET BY MOUTH EVERY DAY   atorvastatin (LIPITOR) 10 MG tablet TAKE 1 TABLET (10 MG TOTAL) BY MOUTH 5 DAYS A WEEK   betamethasone dipropionate (DIPROLENE) 0.05 % ointment Apply topically.   Cholecalciferol (VITAMIN D) 2000 UNITS tablet Take 2,000 Units by mouth daily.   Continuous Glucose Receiver (FREESTYLE LIBRE 3 READER) DEVI on insulin change sensor every 14 days for 90 days   Continuous Glucose Sensor (FREESTYLE LIBRE 3 SENSOR) MISC on insulin, change sensor every 14 days   Cyanocobalamin (B-12 PO) Take 1 tablet by mouth daily.  Dulaglutide 1.5 MG/0.5ML SOPN Inject 1.5 mg into the skin every Friday.   EPINEPHrine 0.3 mg/0.3 mL IJ SOAJ injection Inject 0.3 mg into the muscle as needed for anaphylaxis.   fexofenadine (ALLEGRA) 180 MG tablet Take 180 mg by mouth daily.   fluticasone (FLONASE) 50 MCG/ACT nasal spray Place 2 sprays into both  nostrils daily.   FORTEO 600 MCG/2.4ML SOPN Inject into the skin.   hydrochlorothiazide (HYDRODIURIL) 25 MG tablet TAKE 1 TABLET BY MOUTH EVERY OTHER DAY AS DIRECTED.   levothyroxine (SYNTHROID) 88 MCG tablet Take 1 tablet (88 mcg total) by mouth daily before breakfast.   methocarbamol (ROBAXIN) 750 MG tablet Take 1 tablet (750 mg total) by mouth every 6 (six) hours as needed for muscle spasms.   potassium chloride (KLOR-CON) 10 MEQ tablet TAKE 1 TABLET BY MOUTH TWICE A DAY   sodium chloride (OCEAN) 0.65 % SOLN nasal spray Place 1 spray into both nostrils as needed for congestion.   triamcinolone ointment (KENALOG) 0.1 % Apply 1 Application topically daily as needed (eczema).   No facility-administered encounter medications on file as of 12/17/2022.    Allergies as of 12/17/2022 - Review Complete 12/17/2022  Allergen Reaction Noted   Ampicillin Hives and Swelling 10/06/2010   Ace inhibitors Cough 10/06/2010   Calcium carbonate antacid Hives 10/06/2010   Prinivil [lisinopril] Cough 10/06/2010   Elemental sulfur Itching 02/27/2016   Esomeprazole magnesium  07/25/2015   Omeprazole magnesium Hives 07/25/2015   Penicillins Hives 07/25/2015   Sulfa antibiotics  07/25/2015   Zoledronic acid  07/25/2015   Ranitidine hcl Rash 07/25/2015    Past Medical History:  Diagnosis Date   Allergic rhinitis    Arthritis    Asthma    Balance problem    Colitis    hx of   Diabetes mellitus without complication (HCC)    Diverticulosis    Dyspepsia    Eczema    GERD (gastroesophageal reflux disease)    Hashimoto's thyroiditis    Headache    History of hiatal hernia    Hyperlipidemia    Hypertension    Hypothyroidism    Kidney stones    Nephrolithiasis    Osteopenia    Pericarditis 1999   Pneumonia    PONV (postoperative nausea and vomiting)    PSVT (paroxysmal supraventricular tachycardia) (HCC)    Renal cyst    Shingles    Sjogren's syndrome (HCC)    Tremors of nervous system     Uveitis    Vitamin D deficiency     Past Surgical History:  Procedure Laterality Date   ANTERIOR CERVICAL DECOMPRESSION/DISCECTOMY FUSION 4 LEVELS N/A 10/11/2021   Procedure: ANTERIOR CERVICAL DECOMPRESSION FUSION CERVICAL 4- CERVICAL 5, CERVICAL 5- CERVICAL 6, CERVICAL 6- CERVICAL 7 WITH INSTRUMENTATION AND ALLOGRAFT;  Surgeon: Estill Bamberg, MD;  Location: [facility] OR;  Service: Orthopedics;  Laterality: N/A;   BLADDER REPAIR     BREAST REDUCTION SURGERY Bilateral    DILATION AND CURETTAGE OF UTERUS     LAPAROSCOPIC ENDOMETRIOSIS FULGURATION     appendix removed as well per pt   laparotomy with oopherectomy Left 01/15/1977   MOUTH SURGERY  06/16/2014   TUBAL LIGATION     US ECHOCARDIOGRAPHY  07/27/2009   EF 55-60%    Family History  Adopted: Yes  Problem Relation Age of Onset   Colon cancer Neg Hx    Rectal cancer Neg Hx     Social History   Socioeconomic History   Marital status: Married  Spouse name: Not on file   Number of children: 1   Years of education: grad school   Highest education level: Not on file  Occupational History   Occupation: retired Runner, broadcasting/film/video  Tobacco Use   Smoking status: Never   Smokeless tobacco: Never  Vaping Use   Vaping status: Never Used  Substance and Sexual Activity   Alcohol use: Yes    Comment: 1 drink per year   Drug use: Never   Sexual activity: Not on file  Other Topics Concern   Not on file  Social History Narrative   Lives with husband   Caffeine- coffee 1 daily, occas tea   Right handed predominately   Social Determinants of Health   Financial Resource Strain: Not on file  Food Insecurity: Not on file  Transportation Needs: Not on file  Physical Activity: Not on file  Stress: Not on file  Social Connections: Not on file  Intimate Partner Violence: Not on file      Review of systems: All other review of systems negative except as mentioned in the HPI.   Physical Exam: Vitals:   12/17/22 0850  BP: 130/80  Pulse:  (!) 57   Body mass index is 35.43 kg/m. Gen:      No acute distress HEENT:  sclera anicteric CV: s1s2, rrr Lungs" B/l decreased breath sounds in the base with wheezing Abd:      soft, non-tender; no palpable masses, no distension Ext:    No edema Neuro: alert and oriented x 3 Psych: normal mood and affect  Data Reviewed:  Reviewed labs, radiology imaging, old records and pertinent past GI work up   Assessment and Plan/Recommendations:  Assessment and Plan 77 yr old very pleasant female with history of Hashimoto's thyroiditis, Sjogren's syndrome, hypertension, obesity,irregular bowel habits with alternating constipation and diarrhea.   Diverticulosis with Constipation Infrequent bowel movements (once a week) with subsequent multiple bowel movements in a day. Likely due to stool backup in the colon due to diverticulosis and constipation. -Start Colace 1 tablet at bedtime daily. -Start MiraLAX 1 cap full every morning. -Perform bowel purge with MiraLAX as per instructions provided.  Internal Hemorrhoids Complaints of itching and discomfort. Hemorrhoids identified on examination, but not severe. Likely exacerbated by constipation and straining. -No specific treatment needed at this time. Anticipate improvement with resolution of constipation.  Sjogren's Syndrome Complaints of dryness (nose, eyes, throat) and increased thirst. -Continue to hydrate with water as needed.  Follow-up in 3 months to assess improvement in bowel habits and related symptoms.     The patient was provided an opportunity to ask questions and all were answered. The patient agreed with the plan and demonstrated an understanding of the instructions.  Iona Beard , MD    CC: Sigmund Hazel, MD

## 2022-12-28 ENCOUNTER — Other Ambulatory Visit: Payer: Self-pay | Admitting: Cardiovascular Disease

## 2022-12-28 ENCOUNTER — Other Ambulatory Visit: Payer: Self-pay | Admitting: Interventional Cardiology

## 2023-01-21 IMAGING — CR DG CHEST 2V
2 series · 2 of 2 positions shown · non-contrast
Comparison: Chest x-ray 08/28/2012.

CLINICAL DATA: Cough.  Asthma.

EXAM:
CHEST - 2 VIEW

[w chest pa]
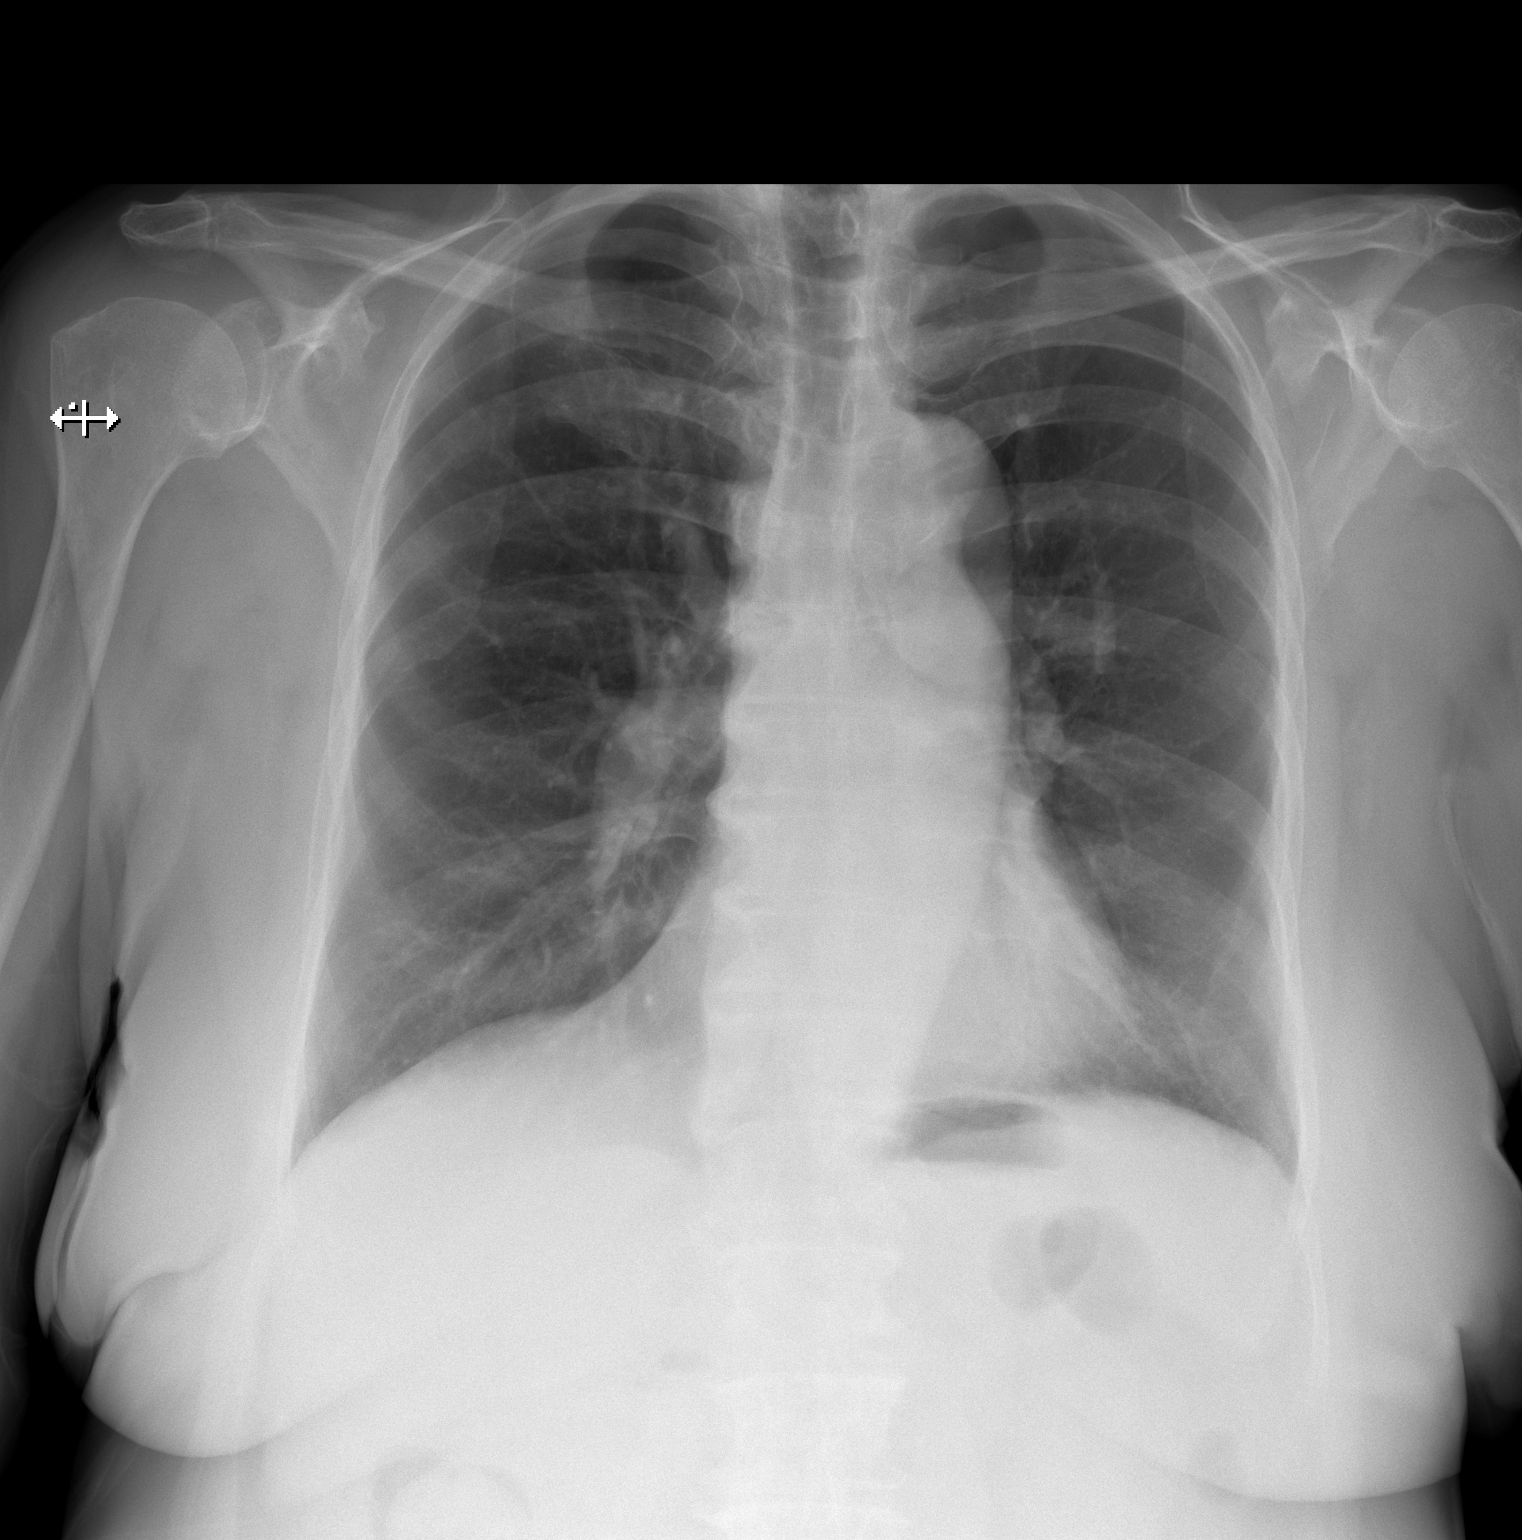

[w chest lat]
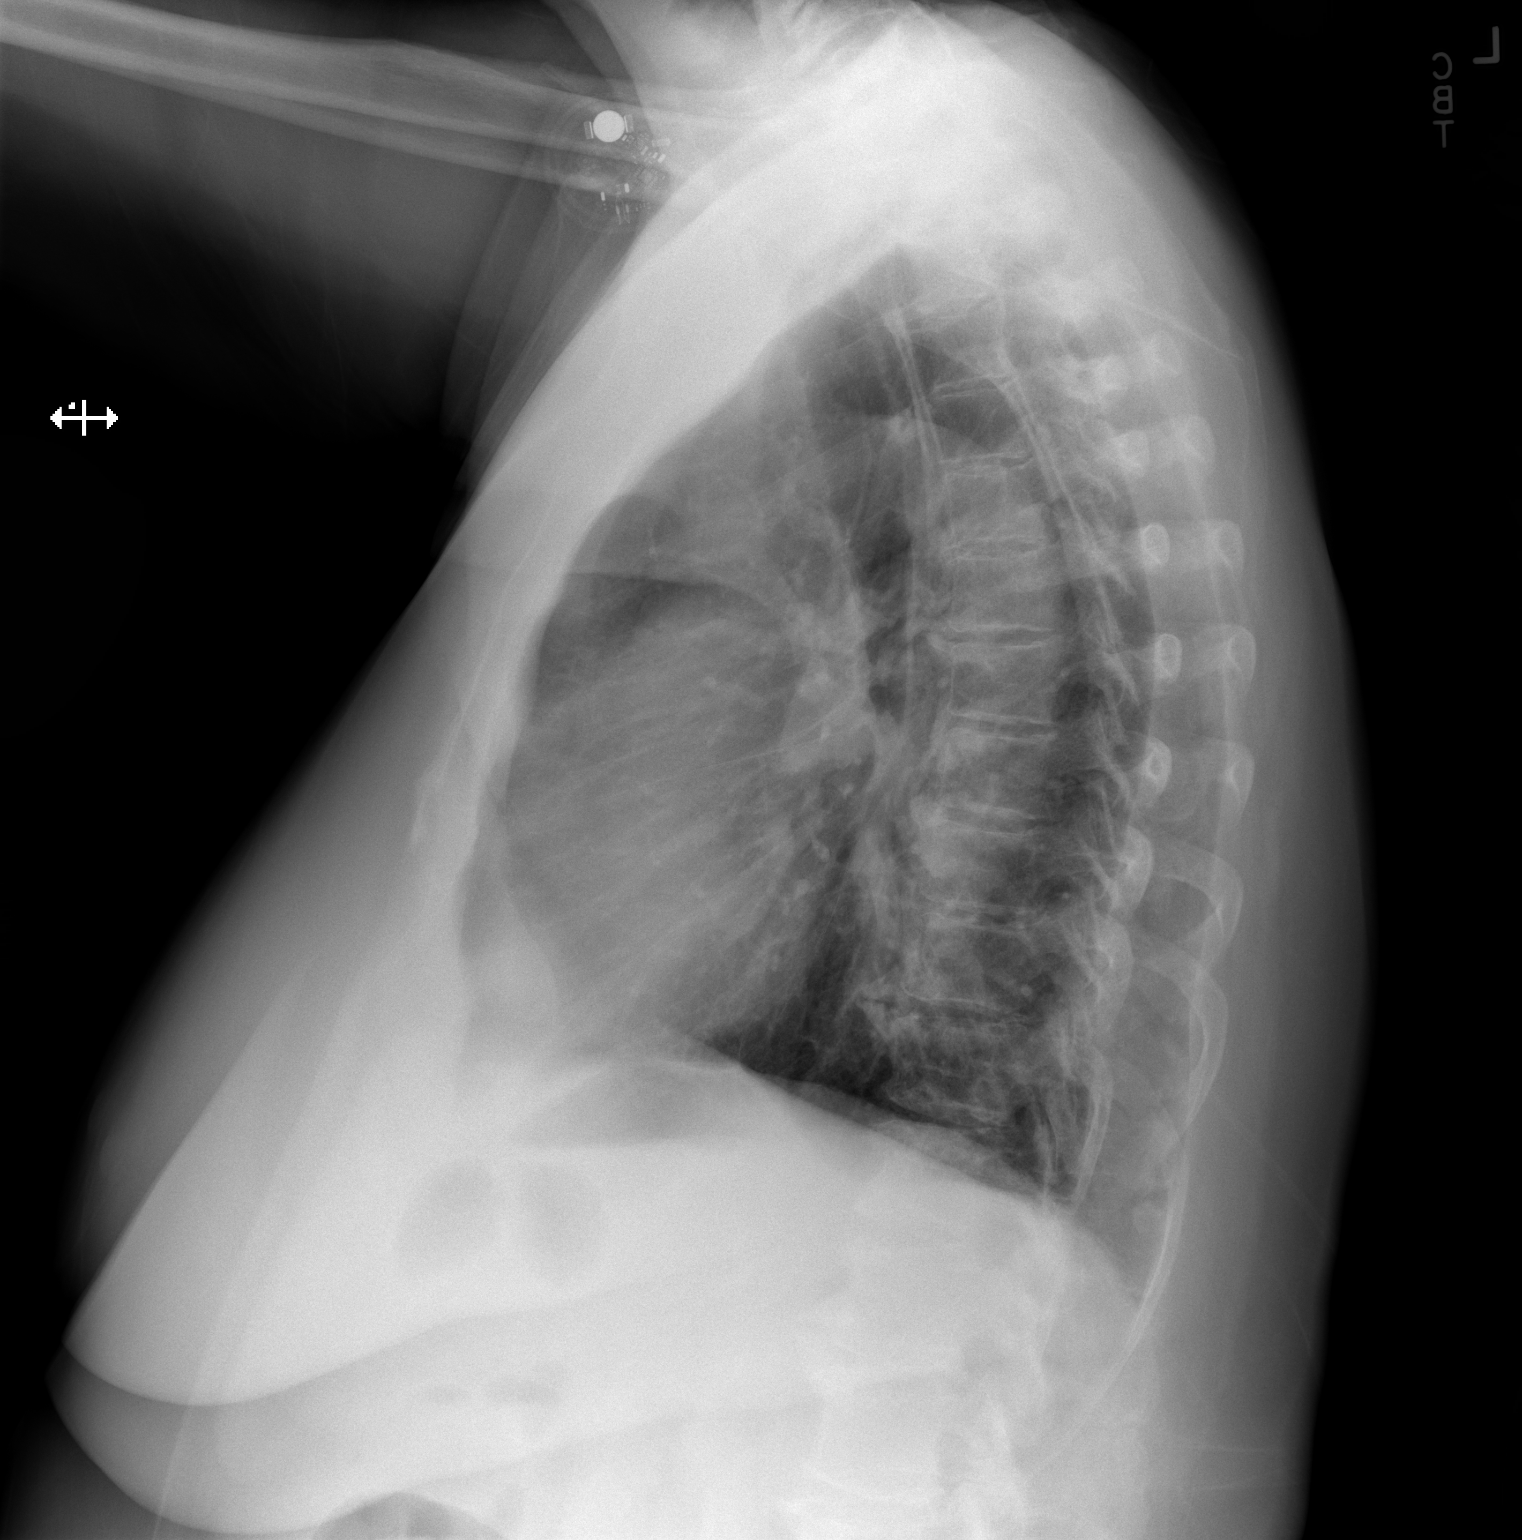

[2 of 2 positions shown; findings below may reference images not displayed]

FINDINGS: Mediastinum and hilar structures normal. Heart size normal. No focal
infiltrate. Mild left base pleural thickening consistent scarring.
No pleural effusion or pneumothorax. Degenerative change thoracic
spine.
IMPRESSION: No acute cardiopulmonary disease.

## 2023-02-05 DIAGNOSIS — J4530 Mild persistent asthma, uncomplicated: Secondary | ICD-10-CM | POA: Diagnosis not present

## 2023-02-05 DIAGNOSIS — Z91011 Allergy to milk products: Secondary | ICD-10-CM | POA: Diagnosis not present

## 2023-02-05 DIAGNOSIS — K219 Gastro-esophageal reflux disease without esophagitis: Secondary | ICD-10-CM | POA: Diagnosis not present

## 2023-02-05 DIAGNOSIS — J3089 Other allergic rhinitis: Secondary | ICD-10-CM | POA: Diagnosis not present

## 2023-02-27 ENCOUNTER — Other Ambulatory Visit: Payer: Self-pay | Admitting: Physician Assistant

## 2023-03-04 ENCOUNTER — Encounter: Payer: Self-pay | Admitting: Nurse Practitioner

## 2023-03-04 ENCOUNTER — Ambulatory Visit: Payer: Medicare PPO | Admitting: Nurse Practitioner

## 2023-03-04 VITALS — BP 130/78 | HR 56 | Ht 63.0 in | Wt 201.0 lb

## 2023-03-04 DIAGNOSIS — K5909 Other constipation: Secondary | ICD-10-CM | POA: Diagnosis not present

## 2023-03-04 DIAGNOSIS — E739 Lactose intolerance, unspecified: Secondary | ICD-10-CM

## 2023-03-04 NOTE — Patient Instructions (Addendum)
Contact Paula,NP in 2 weeks if you are not improving with the Colace.  You can try Lactaid enzymes to help with gastrointestinal symptoms related to eating / drinking mild products.   Increase Colace to one capsule every day. If not having at least 3 BMs a week after a couple of weeks then please contact her for additional recommendations.   Due to recent changes in healthcare laws, you may see the results of your imaging and laboratory studies on MyChart before your provider has had a chance to review them.  We understand that in some cases there may be results that are confusing or concerning to you. Not all laboratory results come back in the same time frame and the provider may be waiting for multiple results in order to interpret others.  Please give [place] 48 hours in order for your provider to thoroughly review all the results before contacting the office for clarification of your results.   It was a pleasure to see you today!  Thank you for trusting me with your gastrointestinal care!    Willette Cluster, NP

## 2023-03-04 NOTE — Progress Notes (Signed)
ASSESSMENT    Brief Narrative:  78 y.o.  female known to Dr. Lavon Paganini  with a past medical history not limited to diverticulosis.  Hashimoto's thyroiditis, HTN, Sjogren's syndrome. See PMH for any additional medical & surgical history  Seen in Dec 2023 for irregular bowel habits, bloating, abdominal pain    Chronic constipation characterized by infrequent BMs Has 3 or less BMs a week. Drinks a lot of water since she has Sjorgren's.  Probable lactose intolerance.  Gets diarrhea and abdominal discomfort with ingestion of any diary products.   PLAN   --Continue with good hydration --Increase Colace from 1 capsule twice weekly to once every night at bedtime. If after a couple of weeks there hasn't been any improvement let us know. Could consider Senna 2-3 times a week. --Historically Miralax hasn't ever worked for her and doesn't want to retry.    --Can use Lactaid enzymes as needed if consuming any dairy products  HPI   Chief complaint : constipation  Brief GI History:  Patient seen 12/17/22 for irregular bowel habits ranging from constipation to diarrhea. She was averaging on BM a week on colace as needed. We recommended a miralax bowel purge followed by daily Colace 1 tablet at bedtime daily, MiraLAX 1 cap full every morning.  INTERVAL HISTORY  Jaxsyn's constipation is better but not resolved. She admits to not taking the Colace everyday as recommended. Takes it about twice a week.  She didn't start daily Miralax since it previously wasn't helpful. She does drink a lot of water. Currently improved from about 1 BM a week to  2-3  BMs a week. She feels like restarting  Vit B supplements improved BMs. Reita Cliche gets diarrhea and abdominal pain if she has just 1-2 bites of anything with dairy.    GI History / Pertinent GI Studies   **All endoscopic studies may not be included here     Colonoscopy for polyp surveillance Oct 2019 - One 2 mm polyp in the transverse colon, removed  .  Severe diverticulosis in the sigmoid colon and in the descending colon. There was narrowing of the colon in association with the diverticular opening. Peri-diverticular erythema was seen. There was evidence of an impacted diverticulum.  Non-bleeding internal hemorrhoids. Non-bleeding internal hemorrhoids.  EGD February 11, 2013  -- irregular Z line and esophagitis. Esophageal biopsies negative for Barrett's esophagus and gastric biopsies negative for intestinal metaplasia, dysplasia or H. pylori.       Latest Ref Rng & Units 05/25/2020   10:15 AM 10/14/2017    2:10 PM  Hepatic Function  Total Protein 6.0 - 8.5 g/dL 7.0  6.8   Albumin 3.7 - 4.7 g/dL 4.3  4.1   AST 0 - 40 IU/L 28  24   ALT 0 - 32 IU/L 42  31   Alk Phosphatase 44 - 121 IU/L 128  89   Total Bilirubin 0.0 - 1.2 mg/dL 0.7  0.6   Bilirubin, Direct 0.00 - 0.40 mg/dL  1.61        Latest Ref Rng & Units 10/03/2021    1:36 PM 04/13/2021    6:26 PM 08/27/2012    5:05 PM  CBC  WBC 4.0 - 10.5 K/uL 8.4  9.0  8.1   Hemoglobin 12.0 - 15.0 g/dL 09.6  04.5  40.9   Hematocrit 36.0 - 46.0 % 43.2  44.8  45.1   Platelets 150 - 400 K/uL 212  235  212.0      Past Medical History:  Diagnosis Date   Allergic rhinitis    Arthritis    Asthma    Balance problem    Colitis    hx of   Diabetes mellitus without complication (HCC)    Diverticulosis    Dyspepsia    Eczema    GERD (gastroesophageal reflux disease)    Hashimoto's thyroiditis    Headache    History of hiatal hernia    Hyperlipidemia    Hypertension    Hypothyroidism    Kidney stones    Nephrolithiasis    Osteopenia    Pericarditis 1999   Pneumonia    PONV (postoperative nausea and vomiting)    PSVT (paroxysmal supraventricular tachycardia) (HCC)    Renal cyst    Shingles    Sjogren's syndrome (HCC)    Tremors of nervous system    Uveitis    Vitamin D deficiency     Past Surgical History:  Procedure Laterality Date   ANTERIOR CERVICAL  DECOMPRESSION/DISCECTOMY FUSION 4 LEVELS N/A 10/11/2021   Procedure: ANTERIOR CERVICAL DECOMPRESSION FUSION CERVICAL 4- CERVICAL 5, CERVICAL 5- CERVICAL 6, CERVICAL 6- CERVICAL 7 WITH INSTRUMENTATION AND ALLOGRAFT;  Surgeon: Estill Bamberg, MD;  Location: [facility] OR;  Service: Orthopedics;  Laterality: N/A;   BLADDER REPAIR     BREAST REDUCTION SURGERY Bilateral    DILATION AND CURETTAGE OF UTERUS     LAPAROSCOPIC ENDOMETRIOSIS FULGURATION     appendix removed as well per pt   laparotomy with oopherectomy Left 01/15/1977   MOUTH SURGERY  06/16/2014   TUBAL LIGATION     US ECHOCARDIOGRAPHY  07/27/2009   EF 55-60%    Family History  Adopted: Yes  Problem Relation Age of Onset   Colon cancer Neg Hx    Rectal cancer Neg Hx     Current Medications, Allergies, Family History and Social History were reviewed in [facility] medical record.     Current Outpatient Medications  Medication Sig Dispense Refill   albuterol (VENTOLIN HFA) 108 (90 Base) MCG/ACT inhaler Inhale 1-2 puffs into the lungs every 6 (six) hours as needed for wheezing or shortness of breath.     amLODipine (NORVASC) 10 MG tablet TAKE 1 TABLET BY MOUTH EVERY DAY 90 tablet 3   ARNUITY ELLIPTA 100 MCG/ACT AEPB Inhale 2 puffs into the lungs daily.  3   atenolol (TENORMIN) 25 MG tablet TAKE 1 TABLET BY MOUTH EVERY DAY 90 tablet 2   atorvastatin (LIPITOR) 10 MG tablet Take 1 tablet (10 mg total) by mouth daily. 60 tablet 6   betamethasone dipropionate (DIPROLENE) 0.05 % ointment Apply topically.     Cholecalciferol (VITAMIN D) 2000 UNITS tablet Take 2,000 Units by mouth daily.     Continuous Glucose Receiver (FREESTYLE LIBRE 3 READER) DEVI on insulin change sensor every 14 days for 90 days     Continuous Glucose Sensor (FREESTYLE LIBRE 3 SENSOR) MISC on insulin, change sensor every 14 days     Cyanocobalamin (B-12 PO) Take 1 tablet by mouth daily.     Dulaglutide 1.5 MG/0.5ML SOPN Inject 1.5 mg into the skin every  Friday.     EPINEPHrine 0.3 mg/0.3 mL IJ SOAJ injection Inject 0.3 mg into the muscle as needed for anaphylaxis.     fexofenadine (ALLEGRA) 180 MG tablet Take 180 mg by mouth daily.     fluticasone (FLONASE) 50 MCG/ACT nasal spray Place 2 sprays into both nostrils daily.  5   FORTEO  600 MCG/2.4ML SOPN Inject into the skin.     hydrochlorothiazide (HYDRODIURIL) 25 MG tablet TAKE 1 TABLET BY MOUTH EVERY OTHER DAY AS DIRECTED. 45 tablet 1   levothyroxine (SYNTHROID) 88 MCG tablet Take 1 tablet (88 mcg total) by mouth daily before breakfast. 1 tablet 0   methocarbamol (ROBAXIN) 750 MG tablet Take 1 tablet (750 mg total) by mouth every 6 (six) hours as needed for muscle spasms. 60 tablet 0   potassium chloride (KLOR-CON) 10 MEQ tablet TAKE 1 TABLET BY MOUTH TWICE A DAY 180 tablet 3   sodium chloride (OCEAN) 0.65 % SOLN nasal spray Place 1 spray into both nostrils as needed for congestion.     triamcinolone ointment (KENALOG) 0.1 % Apply 1 Application topically daily as needed (eczema).     No current facility-administered medications for this visit.    Review of Systems: No chest pain. No shortness of breath. No urinary complaints.    Physical Exam  There were no vitals filed for this visit. Wt Readings from Last 3 Encounters:  12/17/22 200 lb (90.7 kg)  10/08/22 195 lb (88.5 kg)  10/11/21 178 lb (80.7 kg)    BP 130/78   Pulse (!) 56   Ht 5\' 3"  (1.6 m)   Wt 201 lb (91.2 kg)   BMI 35.61 kg/m  Constitutional:  Pleasant, generally well appearing female in no acute distress. Psychiatric: Normal mood and affect. Behavior is normal. EENT: Pupils normal.  Conjunctivae are normal. No scleral icterus. Neck supple.  Cardiovascular: Normal rate, regular rhythm.  Pulmonary/chest: Effort normal and breath sounds normal. No wheezing, rales or rhonchi. Abdominal: Soft, nondistended, nontender. Bowel sounds active throughout. There are no masses palpable. No hepatomegaly. Neurological: Alert and  oriented to person place and time.    Willette Cluster, NP  03/04/2023, 8:37 AM  Cc:  Sigmund Hazel, MD

## 2023-03-11 DIAGNOSIS — E063 Autoimmune thyroiditis: Secondary | ICD-10-CM | POA: Diagnosis not present

## 2023-03-11 DIAGNOSIS — I10 Essential (primary) hypertension: Secondary | ICD-10-CM | POA: Diagnosis not present

## 2023-03-11 DIAGNOSIS — M810 Age-related osteoporosis without current pathological fracture: Secondary | ICD-10-CM | POA: Diagnosis not present

## 2023-03-11 DIAGNOSIS — Z87898 Personal history of other specified conditions: Secondary | ICD-10-CM | POA: Diagnosis not present

## 2023-03-11 DIAGNOSIS — E039 Hypothyroidism, unspecified: Secondary | ICD-10-CM | POA: Diagnosis not present

## 2023-03-11 DIAGNOSIS — E1165 Type 2 diabetes mellitus with hyperglycemia: Secondary | ICD-10-CM | POA: Diagnosis not present

## 2023-03-11 DIAGNOSIS — Z8639 Personal history of other endocrine, nutritional and metabolic disease: Secondary | ICD-10-CM | POA: Diagnosis not present

## 2023-03-14 ENCOUNTER — Other Ambulatory Visit: Payer: Self-pay | Admitting: Interventional Cardiology

## 2023-03-19 DIAGNOSIS — R221 Localized swelling, mass and lump, neck: Secondary | ICD-10-CM | POA: Diagnosis not present

## 2023-03-19 DIAGNOSIS — R5383 Other fatigue: Secondary | ICD-10-CM | POA: Diagnosis not present

## 2023-03-19 DIAGNOSIS — J019 Acute sinusitis, unspecified: Secondary | ICD-10-CM | POA: Diagnosis not present

## 2023-04-08 ENCOUNTER — Ambulatory Visit (HOSPITAL_BASED_OUTPATIENT_CLINIC_OR_DEPARTMENT_OTHER): Payer: Medicare PPO | Admitting: Cardiovascular Disease

## 2023-04-08 ENCOUNTER — Encounter (HOSPITAL_BASED_OUTPATIENT_CLINIC_OR_DEPARTMENT_OTHER): Payer: Self-pay | Admitting: Cardiovascular Disease

## 2023-04-08 VITALS — BP 132/78 | HR 64 | Ht 63.0 in | Wt 201.5 lb

## 2023-04-08 DIAGNOSIS — I119 Hypertensive heart disease without heart failure: Secondary | ICD-10-CM

## 2023-04-08 DIAGNOSIS — M1991 Primary osteoarthritis, unspecified site: Secondary | ICD-10-CM | POA: Diagnosis not present

## 2023-04-08 DIAGNOSIS — H209 Unspecified iridocyclitis: Secondary | ICD-10-CM | POA: Diagnosis not present

## 2023-04-08 DIAGNOSIS — M3501 Sicca syndrome with keratoconjunctivitis: Secondary | ICD-10-CM | POA: Diagnosis not present

## 2023-04-08 DIAGNOSIS — E7800 Pure hypercholesterolemia, unspecified: Secondary | ICD-10-CM

## 2023-04-08 DIAGNOSIS — Z6835 Body mass index (BMI) 35.0-35.9, adult: Secondary | ICD-10-CM | POA: Diagnosis not present

## 2023-04-08 DIAGNOSIS — I471 Supraventricular tachycardia, unspecified: Secondary | ICD-10-CM

## 2023-04-08 DIAGNOSIS — R768 Other specified abnormal immunological findings in serum: Secondary | ICD-10-CM | POA: Diagnosis not present

## 2023-04-08 DIAGNOSIS — E669 Obesity, unspecified: Secondary | ICD-10-CM | POA: Diagnosis not present

## 2023-04-08 NOTE — Progress Notes (Signed)
 Cardiology Office Note:  .   Date:  04/08/2023  ID:  Betty Fox, DOB [birth year], MRN [id number] PCP: Sigmund Hazel, MD  [facility] Providers Cardiologist:  Lance Muss, MD    History of Present Illness: .   Betty Fox is a 78 y.o. female with hypertension, hyperlipidemia, pericarditis, SVT, hypothyroidism, Hashimoto's thyroiditis, diabetes, and asthma here for follow-up.  She previously saw Dr. Eldridge Dace.  She has a history of SVT which she can manage with Valsalva maneuvers.  She was last seen 09/2022 and had a recent episode of palpitations with heart rate in the 120s.  She reported feeling scared.  This is been managed with atenolol.  She had not had any recurrence by the time she was seen in clinic.  She struggled with neck pain from a Barone spur.  Discussed the use of AI scribe software for clinical note transcription with the patient, who gave verbal consent to proceed.  History of Present Illness Betty Fox is a 78 year old female with asthma, heart disease, and hypertension who presents with chest pain. She helps care for her husband with dementia.  Her daughter helps when she can.  She experiences episodes of chest pain that occur suddenly and can happen during exertion or at rest. The pain is severe, requiring her to stop and breathe carefully, but is less intense than a previous episode that occurred a year before her diagnosis of pericarditis. The pain often follows asthma attacks and coughing spells, which she finds physically straining. No recent episodes of severe chest pain since the last visit. No swelling in legs or feet except when traveling. She experiences episodes of dizziness and heart racing.  Her asthma is not well-controlled and she feels that it exacerbates her heart condition. She experiences changes in her voice and drainage in the back of her throat, which she attributes to sinus issues. She uses ear and nose drops to manage these symptoms.  Prednisone helps with her asthma but causes her heart to race and increases her cholesterol.  She also has a history of high blood pressure, for which she takes amlodipine, atenolol, and hydrochlorothiazide. Her cholesterol is managed with atorvastatin, and her diabetes is under control, with her A1c having improved.  She experiences gastrointestinal issues, which she attributes to dairy intolerance. She reports severe pain and gastrointestinal distress even with small amounts of dairy, such as butter. She avoids dairy and uses plant-based alternatives.  She has reduced her physical activity over the past three years due to caregiving responsibilities for her husband, who has dementia. She used to exercise regularly, including walking and weightlifting, but now only does minimal exercises at home. She notes weakness and cramping in her right hand, which she attributes to a metal implant in her neck.  ROS:  As per HPI  Studies Reviewed: .       N/a  Risk Assessment/Calculations:             Physical Exam:   VS:  BP 132/78   Pulse 64   Ht 5\' 3"  (1.6 m)   Wt 201 lb 8 oz (91.4 kg)   SpO2 97%   BMI 35.69 kg/m  , BMI Body mass index is 35.69 kg/m. GENERAL:  Well appearing HEENT: Pupils equal round and reactive, fundi not visualized, oral mucosa unremarkable NECK:  No jugular venous distention, waveform within normal limits, carotid upstroke brisk and symmetric, no bruits, no thyromegaly LUNGS:  Clear to auscultation bilaterally HEART:  RRR.  PMI not displaced or sustained,S1 and S2 within normal limits, no S3, no S4, no clicks, no rubs, no murmurs ABD:  Flat, positive bowel sounds normal in frequency in pitch, no bruits, no rebound, no guarding, no midline pulsatile mass, no hepatomegaly, no splenomegaly EXT:  2 plus pulses throughout, no edema, no cyanosis no clubbing SKIN:  No rashes no nodules NEURO:  Cranial nerves II through XII grossly intact, motor grossly intact  throughout PSYCH:  Cognitively intact, oriented to person place and time   ASSESSMENT AND PLAN: .    Assessment & Plan Chest Pain Intermittent episodes, not consistently exertion-related. Less severe than pericarditis-related pain, which was a remote episode.  Potential cardiac cause considered. - Monitor for increased frequency or severity, especially with exertion. - Consider stress test if exertion-related pain occurs.  # Hypertension Generally well-controlled with occasional elevated readings. On amlodipine, atenolol, and hydrochlorothiazide. Emphasized exercise to avoid medication increase. - Encourage regular exercise and dietary modifications. - Monitor blood pressure regularly at home.  # Asthma Exacerbations linked to sinus issues and coughing. Prednisone effective but with side effects. Discussed asthma's impact on cardiac symptoms. - Continue current asthma management plan. - Use prednisone as needed for severe attacks.  # Hyperlipidemia Cholesterol within acceptable range. On atorvastatin - Repeat fasting lipid panel before next follow-up.  # Diabetes Mellitus Well-managed with improved A1c. Advised to maintain healthy lifestyle. - Continue current diabetes management plan.  Follow-up Monitoring for cardiovascular health and chronic conditions. - Schedule follow-up in six months. - Repeat fasting lab work, including lipid panel and comprehensive metabolic panel, one week before follow-up.     Signed, Chilton Si, MD

## 2023-04-08 NOTE — Patient Instructions (Signed)
 Medication Instructions:  Your physician recommends that you continue on your current medications as directed. Please refer to the Current Medication list given to you today.   *If you need a refill on your cardiac medications before your next appointment, please call your pharmacy*  Lab Work: FASTING LP/CMET 1 WEEK PRIOR TO FOLLOW UP   If you have labs (blood work) drawn today and your tests are completely normal, you will receive your results only by: MyChart Message (if you have MyChart) OR A paper copy in the mail If you have any lab test that is abnormal or we need to change your treatment, we will call you to review the results.  Testing/Procedures: NONE  Follow-Up: At [facility], you and your health needs are our priority.  As part of our continuing mission to provide you with exceptional heart care, we have created designated Provider Care Teams.  These Care Teams include your primary Cardiologist (physician) and Advanced Practice Providers (APPs -  Physician Assistants and Nurse Practitioners) who all work together to provide you with the care you need, when you need it.  We recommend signing up for the patient portal called "MyChart".  Sign up information is provided on this After Visit Summary.  MyChart is used to connect with patients for Virtual Visits (Telemedicine).  Patients are able to view lab/test results, encounter notes, upcoming appointments, etc.  Non-urgent messages can be sent to your provider as well.   To learn more about what you can do with MyChart, go to [email or web address].    Your next appointment:   6 month(s)  Provider:   Chilton Si, MD, Eligha Bridegroom, NP, or Gillian Shields, NP    Other Instructions INCREASE YOUR EXERCISE   LIMIT YOUR SODIUM INTAKE TO 2,300 MG DAILY  Low-Sodium Eating Plan Salt (sodium) helps you keep a healthy balance of fluids in your body. Too much sodium can raise your blood pressure. It can also cause  fluid and waste to be held in your body. Your health care provider or dietitian may recommend a low-sodium eating plan if you have high blood pressure (hypertension), kidney disease, liver disease, or heart failure. Eating less sodium can help lower your blood pressure and reduce swelling. It can also protect your heart, liver, and kidneys. What are tips for following this plan? Reading food labels  Check food labels for the amount of sodium per serving. If you eat more than one serving, you must multiply the listed amount by the number of servings. Choose foods with less than 140 milligrams (mg) of sodium per serving. Avoid foods with 300 mg of sodium or more per serving. Always check how much sodium is in a product, even if the label says "unsalted" or "no salt added." Shopping  Buy products labeled as "low-sodium" or "no salt added." Buy fresh foods. Avoid canned foods and pre-made or frozen meals. Avoid canned, cured, or processed meats. Buy breads that have less than 80 mg of sodium per slice. Cooking  Eat more home-cooked food. Try to eat less restaurant, buffet, and fast food. Try not to add salt when you cook. Use salt-free seasonings or herbs instead of table salt or sea salt. Check with your provider or pharmacist before using salt substitutes. Cook with plant-based oils, such as canola, sunflower, or olive oil. Meal planning When eating at a restaurant, ask if your food can be made with less salt or no salt. Avoid dishes labeled as brined, pickled, cured, or smoked. Avoid dishes  made with soy sauce, miso, or teriyaki sauce. Avoid foods that have monosodium glutamate (MSG) in them. MSG may be added to some restaurant food, sauces, soups, bouillon, and canned foods. Make meals that can be grilled, baked, poached, roasted, or steamed. These are often made with less sodium. General information Try to limit your sodium intake to 1,500-2,300 mg each day, or the amount told by your  provider. What foods should I eat? Fruits Fresh, frozen, or canned fruit. Fruit juice. Vegetables Fresh or frozen vegetables. "No salt added" canned vegetables. "No salt added" tomato sauce and paste. Low-sodium or reduced-sodium tomato and vegetable juice. Grains Low-sodium cereals, such as oats, puffed wheat and rice, and shredded wheat. Low-sodium crackers. Unsalted rice. Unsalted pasta. Low-sodium bread. Whole grain breads and whole grain pasta. Meats and other proteins Fresh or frozen meat, poultry, seafood, and fish. These should have no added salt. Low-sodium canned tuna and salmon. Unsalted nuts. Dried peas, beans, and lentils without added salt. Unsalted canned beans. Eggs. Unsalted nut butters. Dairy Milk. Soy milk. Cheese that is naturally low in sodium, such as ricotta cheese, fresh mozzarella, or Swiss cheese. Low-sodium or reduced-sodium cheese. Cream cheese. Yogurt. Seasonings and condiments Fresh and dried herbs and spices. Salt-free seasonings. Low-sodium mustard and ketchup. Sodium-free salad dressing. Sodium-free light mayonnaise. Fresh or refrigerated horseradish. Lemon juice. Vinegar. Other foods Homemade, reduced-sodium, or low-sodium soups. Unsalted popcorn and pretzels. Low-salt or salt-free chips. The items listed above may not be all the foods and drinks you can have. Talk to a dietitian to learn more. What foods should I avoid? Vegetables Sauerkraut, pickled vegetables, and relishes. Olives. [place] fries. Onion rings. Regular canned vegetables, except low-sodium or reduced-sodium items. Regular canned tomato sauce and paste. Regular tomato and vegetable juice. Frozen vegetables in sauces. Grains Instant hot cereals. Bread stuffing, pancake, and biscuit mixes. Croutons. Seasoned rice or pasta mixes. Noodle soup cups. Boxed or frozen macaroni and cheese. Regular salted crackers. Self-rising flour. Meats and other proteins Meat or fish that is salted, canned, smoked,  spiced, or pickled. Precooked or cured meat, such as sausages or meat loaves. Tomasa Blase. Ham. Pepperoni. Hot dogs. Corned beef. Chipped beef. Salt pork. Jerky. Pickled herring, anchovies, and sardines. Regular canned tuna. Salted nuts. Dairy Processed cheese and cheese spreads. Hard cheeses. Cheese curds. Blue cheese. Feta cheese. String cheese. Regular cottage cheese. Buttermilk. Canned milk. Fats and oils Salted butter. Regular margarine. Ghee. Bacon fat. Seasonings and condiments Onion salt, garlic salt, seasoned salt, table salt, and sea salt. Canned and packaged gravies. Worcestershire sauce. Tartar sauce. Barbecue sauce. Teriyaki sauce. Soy sauce, including reduced-sodium soy sauce. Steak sauce. Fish sauce. Oyster sauce. Cocktail sauce. Horseradish that you find on the shelf. Regular ketchup and mustard. Meat flavorings and tenderizers. Bouillon cubes. Hot sauce. Pre-made or packaged marinades. Pre-made or packaged taco seasonings. Relishes. Regular salad dressings. Salsa. Other foods Salted popcorn and pretzels. Corn chips and puffs. Potato and tortilla chips. Canned or dried soups. Pizza. Frozen entrees and pot pies. The items listed above may not be all the foods and drinks you should avoid. Talk to a dietitian to learn more. This information is not intended to replace advice given to you by your health care provider. Make sure you discuss any questions you have with your health care provider. Document Revised: 01/18/2022 Document Reviewed: 01/18/2022 Elsevier Patient Education  2024 [employer].

## 2023-05-20 ENCOUNTER — Other Ambulatory Visit: Payer: Self-pay | Admitting: Physician Assistant

## 2023-05-23 DIAGNOSIS — E1122 Type 2 diabetes mellitus with diabetic chronic kidney disease: Secondary | ICD-10-CM | POA: Diagnosis not present

## 2023-05-23 DIAGNOSIS — M40204 Unspecified kyphosis, thoracic region: Secondary | ICD-10-CM | POA: Diagnosis not present

## 2023-05-23 DIAGNOSIS — M3500 Sicca syndrome, unspecified: Secondary | ICD-10-CM | POA: Diagnosis not present

## 2023-05-23 DIAGNOSIS — M4800 Spinal stenosis, site unspecified: Secondary | ICD-10-CM | POA: Diagnosis not present

## 2023-05-23 DIAGNOSIS — J4489 Other specified chronic obstructive pulmonary disease: Secondary | ICD-10-CM | POA: Diagnosis not present

## 2023-05-23 DIAGNOSIS — G959 Disease of spinal cord, unspecified: Secondary | ICD-10-CM | POA: Diagnosis not present

## 2023-05-23 DIAGNOSIS — I471 Supraventricular tachycardia, unspecified: Secondary | ICD-10-CM | POA: Diagnosis not present

## 2023-05-23 DIAGNOSIS — E785 Hyperlipidemia, unspecified: Secondary | ICD-10-CM | POA: Diagnosis not present

## 2023-05-23 DIAGNOSIS — M4600 Spinal enthesopathy, site unspecified: Secondary | ICD-10-CM | POA: Diagnosis not present

## 2023-05-23 DIAGNOSIS — E069 Thyroiditis, unspecified: Secondary | ICD-10-CM | POA: Diagnosis not present

## 2023-05-23 DIAGNOSIS — M069 Rheumatoid arthritis, unspecified: Secondary | ICD-10-CM | POA: Diagnosis not present

## 2023-05-28 DIAGNOSIS — H43813 Vitreous degeneration, bilateral: Secondary | ICD-10-CM | POA: Diagnosis not present

## 2023-05-28 DIAGNOSIS — E119 Type 2 diabetes mellitus without complications: Secondary | ICD-10-CM | POA: Diagnosis not present

## 2023-05-28 DIAGNOSIS — Z961 Presence of intraocular lens: Secondary | ICD-10-CM | POA: Diagnosis not present

## 2023-05-28 DIAGNOSIS — H5203 Hypermetropia, bilateral: Secondary | ICD-10-CM | POA: Diagnosis not present

## 2023-06-11 DIAGNOSIS — I10 Essential (primary) hypertension: Secondary | ICD-10-CM | POA: Diagnosis not present

## 2023-06-11 DIAGNOSIS — M810 Age-related osteoporosis without current pathological fracture: Secondary | ICD-10-CM | POA: Diagnosis not present

## 2023-06-11 DIAGNOSIS — E039 Hypothyroidism, unspecified: Secondary | ICD-10-CM | POA: Diagnosis not present

## 2023-06-11 DIAGNOSIS — E119 Type 2 diabetes mellitus without complications: Secondary | ICD-10-CM | POA: Diagnosis not present

## 2023-06-11 DIAGNOSIS — Z8639 Personal history of other endocrine, nutritional and metabolic disease: Secondary | ICD-10-CM | POA: Diagnosis not present

## 2023-06-11 DIAGNOSIS — E063 Autoimmune thyroiditis: Secondary | ICD-10-CM | POA: Diagnosis not present

## 2023-08-13 DIAGNOSIS — Z8709 Personal history of other diseases of the respiratory system: Secondary | ICD-10-CM | POA: Diagnosis not present

## 2023-08-13 DIAGNOSIS — R509 Fever, unspecified: Secondary | ICD-10-CM | POA: Diagnosis not present

## 2023-08-13 DIAGNOSIS — J209 Acute bronchitis, unspecified: Secondary | ICD-10-CM | POA: Diagnosis not present

## 2023-08-13 DIAGNOSIS — R051 Acute cough: Secondary | ICD-10-CM | POA: Diagnosis not present

## 2023-08-13 DIAGNOSIS — J019 Acute sinusitis, unspecified: Secondary | ICD-10-CM | POA: Diagnosis not present

## 2023-08-13 DIAGNOSIS — U071 COVID-19: Secondary | ICD-10-CM | POA: Diagnosis not present

## 2023-09-02 DIAGNOSIS — Z0000 Encounter for general adult medical examination without abnormal findings: Secondary | ICD-10-CM | POA: Diagnosis not present

## 2023-09-02 DIAGNOSIS — Z1331 Encounter for screening for depression: Secondary | ICD-10-CM | POA: Diagnosis not present

## 2023-09-05 ENCOUNTER — Other Ambulatory Visit: Payer: Self-pay | Admitting: Physician Assistant

## 2023-09-24 DIAGNOSIS — E119 Type 2 diabetes mellitus without complications: Secondary | ICD-10-CM | POA: Diagnosis not present

## 2023-09-24 DIAGNOSIS — Z6835 Body mass index (BMI) 35.0-35.9, adult: Secondary | ICD-10-CM | POA: Diagnosis not present

## 2023-09-24 DIAGNOSIS — Z23 Encounter for immunization: Secondary | ICD-10-CM | POA: Diagnosis not present

## 2023-09-24 DIAGNOSIS — J4541 Moderate persistent asthma with (acute) exacerbation: Secondary | ICD-10-CM | POA: Diagnosis not present

## 2023-09-27 DIAGNOSIS — E119 Type 2 diabetes mellitus without complications: Secondary | ICD-10-CM | POA: Diagnosis not present

## 2023-10-16 ENCOUNTER — Encounter (HOSPITAL_BASED_OUTPATIENT_CLINIC_OR_DEPARTMENT_OTHER): Payer: Self-pay | Admitting: Emergency Medicine

## 2023-10-16 ENCOUNTER — Emergency Department (HOSPITAL_BASED_OUTPATIENT_CLINIC_OR_DEPARTMENT_OTHER)

## 2023-10-16 ENCOUNTER — Emergency Department (HOSPITAL_BASED_OUTPATIENT_CLINIC_OR_DEPARTMENT_OTHER)
Admission: EM | Admit: 2023-10-16 | Discharge: 2023-10-16 | Disposition: A | Discharge status: Home / Self Care | Attending: Emergency Medicine | Admitting: Emergency Medicine

## 2023-10-16 ENCOUNTER — Other Ambulatory Visit: Payer: Self-pay

## 2023-10-16 DIAGNOSIS — M1712 Unilateral primary osteoarthritis, left knee: Secondary | ICD-10-CM | POA: Insufficient documentation

## 2023-10-16 DIAGNOSIS — R051 Acute cough: Secondary | ICD-10-CM | POA: Diagnosis not present

## 2023-10-16 DIAGNOSIS — M25561 Pain in right knee: Secondary | ICD-10-CM | POA: Diagnosis not present

## 2023-10-16 DIAGNOSIS — E119 Type 2 diabetes mellitus without complications: Secondary | ICD-10-CM | POA: Insufficient documentation

## 2023-10-16 DIAGNOSIS — M25562 Pain in left knee: Secondary | ICD-10-CM | POA: Diagnosis not present

## 2023-10-16 DIAGNOSIS — M79605 Pain in left leg: Secondary | ICD-10-CM | POA: Diagnosis not present

## 2023-10-16 MED ORDER — DICLOFENAC SODIUM 1 % EX GEL: 2.0000 g | Freq: Four times a day (QID) | CUTANEOUS | 0 refills | Status: AC | PRN

## 2023-10-16 NOTE — ED Provider Notes (Signed)
 [place] EMERGENCY DEPARTMENT AT [facility] Provider Note   CSN: [id number] Arrival date & time: 10/16/23  1711     Patient presents with: Leg Pain   Betty Fox is a 78 y.o. female.  Patient with past history significant for essential tremor, diabetes, paresthesia in the left hand presents emergency department concerns of knee pain.  Reports that she has noted worsening knee pain over the last 1 to 2 weeks and was advised by PCP to come into the emergency department for evaluation of possible DVT.  No history of DVT or PE.  Not on blood thinners.  She states that this pain has been present for about 4 years and has been progressively worsening and notably worsened in the last week to 2 weeks.  No reported recent injury, surgery, or prolonged immobilization.  States that pain typically worsens with movement and mobility.   Leg Pain      Prior to Admission medications   Medication Sig Start Date End Date Taking? Authorizing Provider  diclofenac Sodium (VOLTAREN ARTHRITIS PAIN) 1 % GEL Apply 2 g topically 4 (four) times daily as needed (Joint pain). 10/16/23  Yes Khaila Velarde A, PA-C  albuterol (VENTOLIN HFA) 108 (90 Base) MCG/ACT inhaler Inhale 1-2 puffs into the lungs every 6 (six) hours as needed for wheezing or shortness of breath. 02/13/21   [provider]  amLODipine  (NORVASC ) 10 MG tablet TAKE 1 TABLET BY MOUTH EVERY DAY 12/28/22   Dann Candyce RAMAN, MD  ARNUITY ELLIPTA 100 MCG/ACT AEPB Inhale 2 puffs into the lungs daily. 04/07/15   [provider]  atenolol  (TENORMIN ) 25 MG tablet TAKE 1 TABLET BY MOUTH EVERY DAY 12/10/22   Lucien Orren SAILOR, PA-C  atorvastatin  (LIPITOR) 10 MG tablet Take 1 tablet (10 mg total) by mouth daily. 02/28/23   Conte, Tessa N, PA-C  betamethasone  dipropionate (DIPROLENE ) 0.05 % ointment Apply topically. 09/28/22   [provider]  Cholecalciferol (VITAMIN D) 2000 UNITS tablet Take 2,000 Units by mouth daily.     [provider]  Continuous Glucose Receiver (FREESTYLE LIBRE 3 READER) DEVI on insulin  change sensor every 14 days for 90 days    [provider]  Continuous Glucose Sensor (FREESTYLE LIBRE 3 SENSOR) MISC on insulin , change sensor every 14 days 09/11/22   [provider]  Cyanocobalamin (B-12 PO) Take 1 tablet by mouth daily.    [provider]  Dulaglutide 1.5 MG/0.5ML SOPN Inject 1.5 mg into the skin every Friday. 08/21/20   [provider]  EPINEPHrine  0.3 mg/0.3 mL IJ SOAJ injection Inject 0.3 mg into the muscle as needed for anaphylaxis.    [provider]  fexofenadine (ALLEGRA) 180 MG tablet Take 180 mg by mouth daily.    [provider]  fluticasone (FLONASE) 50 MCG/ACT nasal spray Place 2 sprays into both nostrils daily. 05/18/16   [provider]  FORTEO 600 MCG/2.4ML SOPN Inject into the skin.    [provider]  hydrochlorothiazide  (HYDRODIURIL ) 25 MG tablet TAKE 1 TABLET BY MOUTH EVERY OTHER DAY AS DIRECTED 05/21/23   Raford Riggs, MD  levothyroxine  (SYNTHROID ) 88 MCG tablet Take 1 tablet (88 mcg total) by mouth daily before breakfast. 10/11/21   McKenzie, Kayla J, PA-C  methocarbamol  (ROBAXIN ) 750 MG tablet Take 1 tablet (750 mg total) by mouth every 6 (six) hours as needed for muscle spasms. 10/11/21   McKenzie, Kayla J, PA-C  potassium chloride  (KLOR-CON ) 10 MEQ tablet TAKE 1 TABLET BY MOUTH  TWICE A DAY 12/28/22   Dann Candyce RAMAN, MD  sodium chloride  (OCEAN) 0.65 % SOLN nasal spray Place 1 spray into both nostrils as needed for congestion.    [provider]  triamcinolone ointment (KENALOG) 0.1 % Apply 1 Application topically daily as needed (eczema). 09/22/21   [provider]    Allergies: Ampicillin, Ace inhibitors, Calcium  carbonate antacid, Prinivil [lisinopril], Elemental sulfur, Esomeprazole  magnesium , Flonase [fluticasone], Omeprazole magnesium , Penicillins, Sulfa antibiotics,  Zoledronic acid, and Ranitidine hcl    Review of Systems  Musculoskeletal:        Knee pain  All other systems reviewed and are negative.   Updated Vital Signs BP (!) 149/81   Pulse 80   Temp 98 F (36.7 C)   Resp 17   Ht 5' 3 (1.6 m)   Wt 91.4 kg   SpO2 98%   BMI 35.69 kg/m   Physical Exam Vitals and nursing note reviewed.  Constitutional:      General: She is not in acute distress.    Appearance: She is well-developed.  HENT:     Head: Normocephalic and atraumatic.  Eyes:     Conjunctiva/sclera: Conjunctivae normal.  Cardiovascular:     Rate and Rhythm: Normal rate and regular rhythm.     Heart sounds: No murmur heard. Pulmonary:     Effort: Pulmonary effort is normal. No respiratory distress.     Breath sounds: Normal breath sounds.  Abdominal:     Palpations: Abdomen is soft.     Tenderness: There is no abdominal tenderness.  Musculoskeletal:        General: Tenderness present. No swelling, deformity or signs of injury. Normal range of motion.     Cervical back: Neck supple.     Comments: TTP along the posterior left knee. Pain worsens with flexion and extension. Some midline tenderness and crepitus is noted.  Skin:    General: Skin is warm and dry.     Capillary Refill: Capillary refill takes less than 2 seconds.  Neurological:     Mental Status: She is alert.  Psychiatric:        Mood and Affect: Mood normal.     (all labs ordered are listed, but only abnormal results are displayed) Labs Reviewed - No data to display  EKG: None  Radiology: US  Venous Img Lower  Left (DVT Study) Result Date: 10/16/2023 CLINICAL DATA:  Left leg pain EXAM: LEFT LOWER EXTREMITY VENOUS DOPPLER ULTRASOUND TECHNIQUE: Gray-scale sonography with graded compression, as well as color Doppler and duplex ultrasound were performed to evaluate the lower extremity deep venous systems from the level of the common femoral vein and including the common femoral, femoral, profunda  femoral, popliteal and calf veins including the posterior tibial, peroneal and gastrocnemius veins when visible. The superficial great saphenous vein was also interrogated. Spectral Doppler was utilized to evaluate flow at rest and with distal augmentation maneuvers in the common femoral, femoral and popliteal veins. COMPARISON:  None Available. FINDINGS: Contralateral Common Femoral Vein: Respiratory phasicity is normal and symmetric with the symptomatic side. No evidence of thrombus. Normal compressibility. Common Femoral Vein: No evidence of thrombus. Normal compressibility, respiratory phasicity and response to augmentation. Saphenofemoral Junction: No evidence of thrombus. Normal compressibility and flow on color Doppler imaging. Profunda Femoral Vein: No evidence of thrombus. Normal compressibility and flow on color Doppler imaging. Femoral Vein: No evidence of thrombus. Normal compressibility, respiratory phasicity and response to augmentation. Popliteal Vein: No evidence of thrombus. Normal compressibility, respiratory phasicity  and response to augmentation. Calf Veins: No evidence of thrombus. Normal compressibility and flow on color Doppler imaging. Superficial Great Saphenous Vein: No evidence of thrombus. Normal compressibility. Other Findings: Likely Baker's cyst in the popliteal fossa measuring 6.5 x 3.2 cm. IMPRESSION: Negative for deep venous thrombosis in the left leg. Electronically Signed   By: Rogelia Myers M.D.   On: 10/16/2023 19:38   DG Knee Complete 4 Views Left Result Date: 10/16/2023 CLINICAL DATA:  Left knee pain EXAM: LEFT KNEE - COMPLETE 4+ VIEW COMPARISON:  None Available. FINDINGS: Frontal, bilateral oblique, and lateral views of the left knee are obtained on 5 images. No acute fracture, subluxation, or dislocation. Three compartmental osteoarthritis, greatest in the patellofemoral and medial compartments with moderate joint space narrowing and osteophyte formation. No joint  effusion. The soft tissues are unremarkable. IMPRESSION: 1. Moderate 3 compartmental osteoarthritis, greatest within the medial and patellofemoral compartments. 2. No acute bony abnormality. Electronically Signed   By: Ozell Daring M.D.   On: 10/16/2023 18:41     Procedures   Medications Ordered in the ED - No data to display                                  Medical Decision Making Amount and/or Complexity of Data Reviewed Radiology: ordered.   This patient presents to the ED for concern of knee pain.  Differential diagnosis includes DVT, osteoarthritis, talar injury, dislocation   Imaging Studies ordered:  I ordered imaging studies including left knee x-ray, ultrasound left lower leg I independently visualized and interpreted imaging which showed moderate 3 compartmental osteoarthritis, greatest within the medial and patellofemoral compartments. 2. No acute bony abnormality.  Negative for DVT in the left lower leg. I agree with the radiologist interpretation   Problem List / ED Course:  Patient presents to the emergency department with concerns of left knee pain.  Reports pain is been ongoing for several years but worse in the last 1 to 2 weeks.  States that she notices pain primarily worsened when she tries to stand from a seated position.  States that she noted this particularly while she was at church.  Denies recent falls or other injury.  No reported leg swelling, warmth, or systemic symptoms. Exam of the left knee reveals slight midline joint tenderness and crepitus.  Movement of the knee with flexion and extension does not worsen pain.  No warmth, erythema, or skin induration to suggest cellulitis or other infection in this knee.  Doubt septic arthritis. Based on history, proceed with x-ray and ultrasound of the left lower leg for evaluation.  Suspect likely osteoarthritic type finding given patient's nature of pain. Ultrasound negative for DVT.  X-ray of the left knee shows  moderate 3 compartmental osteoarthritis.  I suspect patient's pain is likely due to arthritis.  She does take Tylenol  for pain.  Will discharge home with a prescription for Voltaren to apply topically to the area.  Encouraged close follow-up with PCP and potentially orthopedics if pain is not improving.  Otherwise stable at this time for outpatient follow-up and discharged home.   Social Determinants of Health:  None  Final diagnoses:  Osteoarthritis of left knee, unspecified osteoarthritis type    ED Discharge Orders          Ordered    diclofenac Sodium (VOLTAREN ARTHRITIS PAIN) 1 % GEL  4 times daily PRN  10/16/23 2027               Cecily Legrand LABOR, PA-C 10/16/23 2329    Mannie Pac T, DO 10/21/23 [id number]

## 2023-10-16 NOTE — Discharge Instructions (Signed)
 You were seen in the ER today for concerns of left knee pain. You did not have any findings of a blood clot in this leg but you do have fairly noticeable arthritis in the left knee. Take Tylenol  for pain. I have prescribed Voltaren which is a topical anti-inflammatory medication to try to help with the pain. Follow up with your primary care provider for further evaluation. Return to the ER for any concerns of new or worsening symptoms.

## 2023-10-16 NOTE — ED Triage Notes (Signed)
 Pt via pov from home with left leg pain. She went to her pcp, who wants her seen for evaluation of possible blood clot. She states this has been going on for 4 years, but has worsened in the last few weeks. Pt reports that she was unable to stand 3 times in church because her leg locked up. Pt a&o x 4; nad noted.

## 2023-10-16 NOTE — ED Notes (Signed)
 Pt d/c instructions, medications, and follow-up care reviewed with pt. Pt verbalized understanding and had no further questions at time of d/c. Pt CA&Ox4 and in NAD at time of d/c

## 2023-10-24 DIAGNOSIS — M8588 Other specified disorders of bone density and structure, other site: Secondary | ICD-10-CM | POA: Diagnosis not present

## 2023-10-24 DIAGNOSIS — Z1231 Encounter for screening mammogram for malignant neoplasm of breast: Secondary | ICD-10-CM | POA: Diagnosis not present

## 2023-10-25 DIAGNOSIS — Z91011 Allergy to milk products, unspecified: Secondary | ICD-10-CM | POA: Diagnosis not present

## 2023-10-25 DIAGNOSIS — J4530 Mild persistent asthma, uncomplicated: Secondary | ICD-10-CM | POA: Diagnosis not present

## 2023-10-25 DIAGNOSIS — J3089 Other allergic rhinitis: Secondary | ICD-10-CM | POA: Diagnosis not present

## 2023-10-25 DIAGNOSIS — H6691 Otitis media, unspecified, right ear: Secondary | ICD-10-CM | POA: Diagnosis not present

## 2023-10-31 DIAGNOSIS — M25562 Pain in left knee: Secondary | ICD-10-CM | POA: Diagnosis not present

## 2023-10-31 DIAGNOSIS — M810 Age-related osteoporosis without current pathological fracture: Secondary | ICD-10-CM | POA: Diagnosis not present

## 2023-10-31 DIAGNOSIS — E559 Vitamin D deficiency, unspecified: Secondary | ICD-10-CM | POA: Diagnosis not present

## 2023-10-31 DIAGNOSIS — R5383 Other fatigue: Secondary | ICD-10-CM | POA: Diagnosis not present

## 2023-11-21 ENCOUNTER — Telehealth: Payer: Self-pay | Admitting: Cardiovascular Disease

## 2023-11-21 MED ORDER — ATENOLOL 25 MG PO TABS: 25.0000 mg | ORAL_TABLET | Freq: Every day | ORAL | 0 refills | Status: DC

## 2023-11-21 NOTE — Telephone Encounter (Signed)
*  STAT* If patient is at the pharmacy, call can be transferred to refill team.   1. Which medications need to be refilled? (please list name of each medication and dose if known)   atenolol  (TENORMIN ) 25 MG tablet    2. Which pharmacy/location (including street and city if local pharmacy) is medication to be sent to?  [id number] - [place], [place] - [address]      3. Do they need a 30 day or 90 day supply? 90 day   Pt is out of medication

## 2023-11-21 NOTE — Telephone Encounter (Signed)
30 day refill [facility] to [facility]

## 2023-11-26 DIAGNOSIS — K5900 Constipation, unspecified: Secondary | ICD-10-CM | POA: Diagnosis not present

## 2023-11-26 DIAGNOSIS — Z6834 Body mass index (BMI) 34.0-34.9, adult: Secondary | ICD-10-CM | POA: Diagnosis not present

## 2023-11-26 DIAGNOSIS — M810 Age-related osteoporosis without current pathological fracture: Secondary | ICD-10-CM | POA: Diagnosis not present

## 2023-11-26 DIAGNOSIS — R32 Unspecified urinary incontinence: Secondary | ICD-10-CM | POA: Diagnosis not present

## 2023-11-26 DIAGNOSIS — Z01419 Encounter for gynecological examination (general) (routine) without abnormal findings: Secondary | ICD-10-CM | POA: Diagnosis not present

## 2023-12-04 ENCOUNTER — Other Ambulatory Visit: Payer: Self-pay

## 2023-12-06 MED ORDER — AMLODIPINE BESYLATE 10 MG PO TABS: 10.0000 mg | ORAL_TABLET | Freq: Every day | ORAL | 1 refills | Status: AC

## 2023-12-11 DIAGNOSIS — N281 Cyst of kidney, acquired: Secondary | ICD-10-CM | POA: Diagnosis not present

## 2023-12-18 ENCOUNTER — Telehealth: Payer: Self-pay

## 2023-12-23 MED ORDER — ATENOLOL 25 MG PO TABS: 25.0000 mg | ORAL_TABLET | Freq: Every day | ORAL | 0 refills | Status: AC

## 2023-12-23 NOTE — Telephone Encounter (Signed)
*  STAT* If patient is at the pharmacy, call can be transferred to refill team.   1. Which medications need to be refilled? (please list name of each medication and dose if known)   atenolol  (TENORMIN ) 25 MG tablet   2. Would you like to learn more about the convenience, safety, & potential cost savings by using the [facility]?   3. Are you open to using the [facility] (Type [facility]. ).  4. Which pharmacy/location (including street and city if local pharmacy) is medication to be sent to?  [id number] - [place], [place] - [address]   5. Do they need a 30 day or 90 day supply?    Patient stated she will be out of medication on Friday (12/12).  Patient has appointment with KYM Finder, NP on 2/27.

## 2023-12-23 NOTE — Telephone Encounter (Signed)
 Pt scheduled to see Reche Finder, NP, 03/12/24.

## 2024-02-12 ENCOUNTER — Other Ambulatory Visit: Payer: Self-pay | Admitting: Cardiovascular Disease

## 2024-02-13 ENCOUNTER — Other Ambulatory Visit: Payer: Self-pay | Admitting: Cardiovascular Disease

## 2024-02-17 MED ORDER — POTASSIUM CHLORIDE ER 10 MEQ PO TBCR: 10.0000 meq | EXTENDED_RELEASE_TABLET | Freq: Two times a day (BID) | ORAL | 0 refills | Status: AC

## 2024-03-13 ENCOUNTER — Ambulatory Visit (HOSPITAL_BASED_OUTPATIENT_CLINIC_OR_DEPARTMENT_OTHER): Admitting: Family
